# Patient Record
Sex: Female | Born: 1975
Health system: Southern US, Community
[De-identification: ages and names within clinical notes are randomized; demographics above are authoritative.]

## PROBLEM LIST (undated history)

## (undated) DIAGNOSIS — I10 Essential (primary) hypertension: Secondary | ICD-10-CM

## (undated) DIAGNOSIS — N879 Dysplasia of cervix uteri, unspecified: Secondary | ICD-10-CM

## (undated) DIAGNOSIS — R87612 Low grade squamous intraepithelial lesion on cytologic smear of cervix (LGSIL): Secondary | ICD-10-CM

## (undated) DIAGNOSIS — F329 Major depressive disorder, single episode, unspecified: Secondary | ICD-10-CM

## (undated) DIAGNOSIS — F419 Anxiety disorder, unspecified: Secondary | ICD-10-CM

## (undated) DIAGNOSIS — E669 Obesity, unspecified: Secondary | ICD-10-CM

## (undated) DIAGNOSIS — F32A Depression, unspecified: Secondary | ICD-10-CM

## (undated) DIAGNOSIS — K5900 Constipation, unspecified: Secondary | ICD-10-CM

## (undated) DIAGNOSIS — R87811 Vaginal high risk human papillomavirus (HPV) DNA test positive: Secondary | ICD-10-CM

## (undated) DIAGNOSIS — R87810 Cervical high risk human papillomavirus (HPV) DNA test positive: Secondary | ICD-10-CM

## (undated) DIAGNOSIS — E785 Hyperlipidemia, unspecified: Secondary | ICD-10-CM

## (undated) HISTORY — DX: Obesity, unspecified: E66.9

## (undated) HISTORY — DX: Hyperlipidemia, unspecified: E78.5

## (undated) HISTORY — DX: Dysplasia of cervix uteri, unspecified: N87.9

## (undated) HISTORY — DX: Low grade squamous intraepithelial lesion on cytologic smear of cervix (LGSIL): R87.612

## (undated) HISTORY — DX: Constipation, unspecified: K59.00

## (undated) HISTORY — DX: Cervical high risk human papillomavirus (HPV) DNA test positive: R87.810

## (undated) HISTORY — DX: Anxiety disorder, unspecified: F41.9

## (undated) HISTORY — PX: COLPOSCOPY: SHX161

## (undated) HISTORY — DX: Essential (primary) hypertension: I10

## (undated) HISTORY — DX: Vaginal high risk human papillomavirus (HPV) DNA test positive: R87.811

## (undated) HISTORY — PX: TUBAL LIGATION: SHX77

## (undated) HISTORY — DX: Depression, unspecified: F32.A

---
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
## 1996-02-17 DIAGNOSIS — O365990 Maternal care for other known or suspected poor fetal growth, unspecified trimester, not applicable or unspecified: Secondary | ICD-10-CM

## 1996-02-17 DIAGNOSIS — O139 Gestational [pregnancy-induced] hypertension without significant proteinuria, unspecified trimester: Secondary | ICD-10-CM

## 2015-04-01 ENCOUNTER — Encounter: Payer: Self-pay | Admitting: Unknown Physician Specialty

## 2015-04-01 ENCOUNTER — Ambulatory Visit (INDEPENDENT_AMBULATORY_CARE_PROVIDER_SITE_OTHER): Payer: 59 | Admitting: Unknown Physician Specialty

## 2015-04-01 VITALS — BP 121/76 | HR 77 | Temp 98.5°F | Ht 62.7 in | Wt 163.0 lb

## 2015-04-01 DIAGNOSIS — I129 Hypertensive chronic kidney disease with stage 1 through stage 4 chronic kidney disease, or unspecified chronic kidney disease: Secondary | ICD-10-CM

## 2015-04-01 DIAGNOSIS — E785 Hyperlipidemia, unspecified: Secondary | ICD-10-CM

## 2015-04-01 DIAGNOSIS — N189 Chronic kidney disease, unspecified: Secondary | ICD-10-CM | POA: Diagnosis not present

## 2015-04-01 LAB — LIPID PANEL PICCOLO, WAIVED
Chol/HDL Ratio Piccolo,Waive: 3.7 mg/dL
Cholesterol Piccolo, Waived: 219 mg/dL — ABNORMAL HIGH (ref <200)
HDL Chol Piccolo, Waived: 60 mg/dL (ref >59)
LDL Chol Calc Piccolo Waived: 116 mg/dL — ABNORMAL HIGH (ref <100)
Triglycerides Piccolo,Waived: 215 mg/dL — ABNORMAL HIGH (ref <150)
VLDL Chol Calc Piccolo,Waive: 43 mg/dL — ABNORMAL HIGH (ref <30)

## 2015-04-01 LAB — MICROALBUMIN, URINE WAIVED
Creatinine, Urine Waived: 100 mg/dL (ref 10–300)
Microalb, Ur Waived: 30 mg/L — ABNORMAL HIGH (ref 0–19)
Microalb/Creat Ratio: <30 mg/g (ref <30)

## 2015-04-01 MED ORDER — CHLORTHALIDONE 25 MG PO TABS: 25.0000 mg | ORAL_TABLET | Freq: Every day | ORAL | Status: DC

## 2015-04-01 MED ORDER — LISINOPRIL 2.5 MG PO TABS: 2.5000 mg | ORAL_TABLET | Freq: Every day | ORAL | Status: DC

## 2015-04-01 MED ORDER — GEMFIBROZIL 600 MG PO TABS: 600.0000 mg | ORAL_TABLET | Freq: Two times a day (BID) | ORAL | Status: DC

## 2015-04-01 NOTE — Assessment & Plan Note (Signed)
Reviewed Lipid panel.  Stable

## 2015-04-01 NOTE — Progress Notes (Signed)
BP 121/76 mmHg  Pulse 77  Temp(Src) 98.5 F (36.9 C)  Ht 5' 2.7" (1.593 m)  Wt 163 lb (73.936 kg)  BMI 29.14 kg/m2  SpO2 98%  LMP 03/07/2015 (Exact Date)   Subjective:    Patient ID: Julia Campbell, female    DOB: 12/23/1975, 39 y.o.   MRN: [id number]  HPI: Julia Campbell is a 39 y.o. female  Chief Complaint  Patient presents with  . Hyperlipidemia  . Hypertension   Satisfied with current treatment?  yes  H6  BP monitoring frequency:  rare H5  BP medication side effects:  no P1 Cholesterol medication side effects:  no P1  Cholesterol supplements:  none  P1 Past cholesterol medications:  none  P1 Medication compliance:  usually uses medication as prescribed   P1 takes everything once a day, sometimes forgets second dose Recent stressors:  no  H6   Recurrent headaches:  no  R10 Visual changes:  no  R2  Palpitations:  no  R4  Dyspnea:  no  R5  Chest pain:  no  R4  Lower extremity edema:  no  R4  Dizzy/lightheaded:  no  R10     Relevant past medical, surgical, family and social history reviewed and updated as indicated. Interim medical history since our last visit reviewed. Allergies and medications reviewed and updated.  Review of Systems  All other systems reviewed and are negative.   Per HPI unless specifically indicated above     Objective:    BP 121/76 mmHg  Pulse 77  Temp(Src) 98.5 F (36.9 C)  Ht 5' 2.7" (1.593 m)  Wt 163 lb (73.936 kg)  BMI 29.14 kg/m2  SpO2 98%  LMP 03/07/2015 (Exact Date)  Wt Readings from Last 3 Encounters:  04/01/15 163 lb (73.936 kg)    Physical Exam  Constitutional: She is oriented to person, place, and time. She appears well-developed and well-nourished. No distress.  HENT:  Head: Normocephalic and atraumatic.  Eyes: Conjunctivae and lids are normal. Right eye exhibits no discharge. Left eye exhibits no discharge. No scleral icterus.  Cardiovascular: Normal rate, regular rhythm and normal heart sounds.  Exam reveals no  gallop and no friction rub.   No murmur heard. Pulmonary/Chest: Effort normal and breath sounds normal. No respiratory distress.  Abdominal: Normal appearance. There is no splenomegaly or hepatomegaly.  Musculoskeletal: Normal range of motion.  Neurological: She is alert and oriented to person, place, and time.  Skin: Skin is intact. No rash noted. No pallor.  Psychiatric: She has a normal mood and affect. Her behavior is normal. Judgment and thought content normal.       Assessment & Plan:   Problem List Items Addressed This Visit      Cardiovascular and Mediastinum   Benign hypertension with chronic kidney disease - Primary    Stable.  Continue present medication.  Microalbumin 30.  Continue ACE      Relevant Medications   lisinopril (PRINIVIL,ZESTRIL) 2.5 MG tablet   gemfibrozil (LOPID) 600 MG tablet   chlorthalidone (HYGROTON) 25 MG tablet   Other Relevant Orders   Comprehensive metabolic panel   Microalbumin, Urine Waived     Other   Hyperlipidemia    Reviewed Lipid panel.  Stable      Relevant Medications   lisinopril (PRINIVIL,ZESTRIL) 2.5 MG tablet   gemfibrozil (LOPID) 600 MG tablet   chlorthalidone (HYGROTON) 25 MG tablet   Other Relevant Orders   Lipid Panel Piccolo, [place]  Follow up plan:    No Follow-up on file.

## 2015-04-01 NOTE — Assessment & Plan Note (Addendum)
Stable.  Continue present medication.  Microalbumin 30.  Continue ACE

## 2015-04-02 LAB — COMPREHENSIVE METABOLIC PANEL
ALT: 11 IU/L (ref 0–32)
AST: 15 IU/L (ref 0–40)
Albumin/Globulin Ratio: 1.7 (ref 1.1–2.5)
Albumin: 4.3 g/dL (ref 3.5–5.5)
Alkaline Phosphatase: 56 IU/L (ref 39–117)
BUN/Creatinine Ratio: 19 (ref 9–23)
BUN: 13 mg/dL (ref 6–24)
Bilirubin Total: 0.4 mg/dL (ref 0.0–1.2)
CO2: 24 mmol/L (ref 18–29)
Calcium: 9.5 mg/dL (ref 8.7–10.2)
Chloride: 98 mmol/L (ref 97–108)
Creatinine, Ser: 0.7 mg/dL (ref 0.57–1.00)
GFR calc Af Amer: 124 mL/min/{1.73_m2} (ref >59)
GFR calc non Af Amer: 108 mL/min/{1.73_m2} (ref >59)
Globulin, Total: 2.5 g/dL (ref 1.5–4.5)
Glucose: 85 mg/dL (ref 65–99)
Potassium: 4.2 mmol/L (ref 3.5–5.2)
Sodium: 138 mmol/L (ref 134–144)
Total Protein: 6.8 g/dL (ref 6.0–8.5)

## 2015-04-22 ENCOUNTER — Other Ambulatory Visit: Payer: Self-pay | Admitting: Unknown Physician Specialty

## 2015-10-05 ENCOUNTER — Encounter: Payer: Self-pay | Admitting: Unknown Physician Specialty

## 2015-10-05 ENCOUNTER — Ambulatory Visit (INDEPENDENT_AMBULATORY_CARE_PROVIDER_SITE_OTHER): Payer: 59 | Admitting: Unknown Physician Specialty

## 2015-10-05 VITALS — BP 118/78 | HR 87 | Temp 98.5°F | Ht 62.5 in | Wt 177.8 lb

## 2015-10-05 DIAGNOSIS — E785 Hyperlipidemia, unspecified: Secondary | ICD-10-CM | POA: Diagnosis not present

## 2015-10-05 DIAGNOSIS — N189 Chronic kidney disease, unspecified: Secondary | ICD-10-CM | POA: Diagnosis not present

## 2015-10-05 DIAGNOSIS — I129 Hypertensive chronic kidney disease with stage 1 through stage 4 chronic kidney disease, or unspecified chronic kidney disease: Secondary | ICD-10-CM | POA: Diagnosis not present

## 2015-10-05 LAB — LIPID PANEL PICCOLO, WAIVED
Chol/HDL Ratio Piccolo,Waive: 4.3 mg/dL
Cholesterol Piccolo, Waived: 243 mg/dL — ABNORMAL HIGH (ref <200)
HDL Chol Piccolo, Waived: 57 mg/dL — ABNORMAL LOW (ref >59)
LDL Chol Calc Piccolo Waived: 137 mg/dL — ABNORMAL HIGH (ref <100)
Triglycerides Piccolo,Waived: 248 mg/dL — ABNORMAL HIGH (ref <150)
VLDL Chol Calc Piccolo,Waive: 50 mg/dL — ABNORMAL HIGH (ref <30)

## 2015-10-05 LAB — MICROALBUMIN, URINE WAIVED
Creatinine, Urine Waived: 100 mg/dL (ref 10–300)
Microalb, Ur Waived: 30 mg/L — ABNORMAL HIGH (ref 0–19)
Microalb/Creat Ratio: <30 mg/g (ref <30)

## 2015-10-05 NOTE — Assessment & Plan Note (Signed)
LDL is up to 137.  ASCVD calculator says not in statin benefit group.  Will continue to monitor.  Discussed diet changes along with the Lopid to reduce cholesterol

## 2015-10-05 NOTE — Assessment & Plan Note (Signed)
Stable, continue present medications.   

## 2015-10-05 NOTE — Progress Notes (Signed)
BP 118/78 mmHg  Pulse 87  Temp(Src) 98.5 F (36.9 C)  Ht 5' 2.5" (1.588 m)  Wt 177 lb 12.8 oz (80.65 kg)  BMI 31.98 kg/m2  SpO2 98%  LMP 09/29/2015 (Exact Date)   Subjective:    Patient ID: Julia Campbell, female    DOB: 1976/01/06, 40 y.o.   MRN: [id number]  HPI: Julia Campbell is a 40 y.o. female  Chief Complaint  Patient presents with  . Hyperlipidemia  . Hypertension   Hypertension Using medications without difficulty Average home BPs typical pf what we got today   No problems or lightheadedness No chest pain with exertion or shortness of breath No Edema   Hyperlipidemia/hypertriglyceremia Using medications without problems No Muscle aches  Diet compliance: will work on this starting February.   Exercise: "very little"   Relevant past medical, surgical, family and social history reviewed and updated as indicated. Interim medical history since our last visit reviewed. Allergies and medications reviewed and updated.  Review of Systems  Per HPI unless specifically indicated above     Objective:    BP 118/78 mmHg  Pulse 87  Temp(Src) 98.5 F (36.9 C)  Ht 5' 2.5" (1.588 m)  Wt 177 lb 12.8 oz (80.65 kg)  BMI 31.98 kg/m2  SpO2 98%  LMP 09/29/2015 (Exact Date)  Wt Readings from Last 3 Encounters:  10/05/15 177 lb 12.8 oz (80.65 kg)  04/01/15 163 lb (73.936 kg)    Physical Exam  Constitutional: She is oriented to person, place, and time. She appears well-developed and well-nourished. No distress.  HENT:  Head: Normocephalic and atraumatic.  Eyes: Conjunctivae and lids are normal. Right eye exhibits no discharge. Left eye exhibits no discharge. No scleral icterus.  Neck: Normal range of motion. Neck supple. No JVD present. Carotid bruit is not present.  Cardiovascular: Normal rate, regular rhythm and normal heart sounds.   Pulmonary/Chest: Effort normal and breath sounds normal.  Abdominal: Normal appearance. There is no splenomegaly or hepatomegaly.   Musculoskeletal: Normal range of motion.  Neurological: She is alert and oriented to person, place, and time.  Skin: Skin is warm, dry and intact. No rash noted. No pallor.  Psychiatric: She has a normal mood and affect. Her behavior is normal. Judgment and thought content normal.    Results for orders placed or performed in visit on 04/01/15  Lipid Panel Piccol[facility], [employer]  Result Value Ref Range   Cholesterol Piccolo, Waived 219 (H) <200 mg/dL   HDL Chol Piccolo, Waived 60 >59 mg/dL   Triglycerides Piccolo,Waived 215 (H) <150 mg/dL   Chol/HDL Ratio Piccolo,Waive 3.7 mg/dL   LDL Chol Calc Piccolo Waived 116 (H) <100 mg/dL   VLDL Chol Calc Piccolo,Waive 43 (H) <30 mg/dL  Comprehensive metabolic panel  Result Value Ref Range   Glucose 85 65 - 99 mg/dL   BUN 13 6 - 24 mg/dL   Creatinine, Ser 0.70 0.57 - 1.00 mg/dL   GFR calc non Af Amer 108 >59 mL/min/1.73   GFR calc Af Amer 124 >59 mL/min/1.73   BUN/Creatinine Ratio 19 9 - 23   Sodium 138 134 - 144 mmol/L   Potassium 4.2 3.5 - 5.2 mmol/L   Chloride 98 97 - 108 mmol/L   CO2 24 18 - 29 mmol/L   Calcium 9.5 8.7 - 10.2 mg/dL   Total Protein 6.8 6.0 - 8.5 g/dL   Albumin 4.3 3.5 - 5.5 g/dL   Globulin, Total 2.5 1.5 - 4.5 g/dL   Albumin/Globulin Ratio 1.7  1.1 - 2.5   Bilirubin Total 0.4 0.0 - 1.2 mg/dL   Alkaline Phosphatase 56 39 - 117 IU/L   AST 15 0 - 40 IU/L   ALT 11 0 - 32 IU/L  Microalbumin, Urine Waived  Result Value Ref Range   Microalb, Ur Waived 30 (H) 0 - 19 mg/L   Creatinine, Urine Waived 100 10 - 300 mg/dL   Microalb/Creat Ratio <30 <30 mg/g      Assessment & Plan:   Problem List Items Addressed This Visit      Unprioritized   Benign hypertension with chronic kidney disease - Primary    Stable, continue present medications.        Relevant Orders   Comprehensive metabolic panel   Microalbumin, Urine Waived   Uric acid   Hyperlipidemia    LDL is up to 137.  ASCVD calculator says not in statin benefit  group.  Will continue to monitor.  Discussed diet changes along with the Lopid to reduce cholesterol      Relevant Orders   Lipid Panel Piccolo, Waived       Follow up plan: Return in about 6 months (around 04/03/2016).

## 2015-10-06 LAB — URIC ACID: Uric Acid: 6.2 mg/dL (ref 2.5–7.1)

## 2015-10-06 LAB — COMPREHENSIVE METABOLIC PANEL
ALT: 12 IU/L (ref 0–32)
AST: 11 IU/L (ref 0–40)
Albumin/Globulin Ratio: 1.6 (ref 1.1–2.5)
Albumin: 4.2 g/dL (ref 3.5–5.5)
Alkaline Phosphatase: 56 IU/L (ref 39–117)
BUN/Creatinine Ratio: 17 (ref 9–23)
BUN: 11 mg/dL (ref 6–24)
Bilirubin Total: 0.3 mg/dL (ref 0.0–1.2)
CO2: 25 mmol/L (ref 18–29)
Calcium: 9.5 mg/dL (ref 8.7–10.2)
Chloride: 99 mmol/L (ref 96–106)
Creatinine, Ser: 0.66 mg/dL (ref 0.57–1.00)
GFR calc Af Amer: 127 mL/min/{1.73_m2} (ref >59)
GFR calc non Af Amer: 110 mL/min/{1.73_m2} (ref >59)
Globulin, Total: 2.7 g/dL (ref 1.5–4.5)
Glucose: 86 mg/dL (ref 65–99)
Potassium: 4.5 mmol/L (ref 3.5–5.2)
Sodium: 138 mmol/L (ref 134–144)
Total Protein: 6.9 g/dL (ref 6.0–8.5)

## 2016-04-04 ENCOUNTER — Ambulatory Visit (INDEPENDENT_AMBULATORY_CARE_PROVIDER_SITE_OTHER): Payer: 59 | Admitting: Unknown Physician Specialty

## 2016-04-04 ENCOUNTER — Encounter: Payer: Self-pay | Admitting: Unknown Physician Specialty

## 2016-04-04 ENCOUNTER — Encounter (INDEPENDENT_AMBULATORY_CARE_PROVIDER_SITE_OTHER): Payer: Self-pay

## 2016-04-04 VITALS — BP 114/76 | HR 71 | Temp 98.2°F | Ht 62.5 in | Wt 165.2 lb

## 2016-04-04 DIAGNOSIS — Z5181 Encounter for therapeutic drug level monitoring: Secondary | ICD-10-CM

## 2016-04-04 DIAGNOSIS — I10 Essential (primary) hypertension: Secondary | ICD-10-CM | POA: Diagnosis not present

## 2016-04-04 DIAGNOSIS — E785 Hyperlipidemia, unspecified: Secondary | ICD-10-CM

## 2016-04-04 LAB — LIPID PANEL PICCOLO, WAIVED
Chol/HDL Ratio Piccolo,Waive: 3.9 mg/dL
Cholesterol Piccolo, Waived: 223 mg/dL — ABNORMAL HIGH (ref <200)
HDL Chol Piccolo, Waived: 57 mg/dL — ABNORMAL LOW (ref >59)
LDL Chol Calc Piccolo Waived: 125 mg/dL — ABNORMAL HIGH (ref <100)
Triglycerides Piccolo,Waived: 203 mg/dL — ABNORMAL HIGH (ref <150)
VLDL Chol Calc Piccolo,Waive: 41 mg/dL — ABNORMAL HIGH (ref <30)

## 2016-04-04 MED ORDER — LISINOPRIL 2.5 MG PO TABS: 2.5000 mg | ORAL_TABLET | Freq: Every day | ORAL | Status: DC

## 2016-04-04 MED ORDER — GEMFIBROZIL 600 MG PO TABS: 600.0000 mg | ORAL_TABLET | Freq: Two times a day (BID) | ORAL | Status: DC

## 2016-04-04 NOTE — Assessment & Plan Note (Signed)
All numbers improved.  Continue present medications, diet and exercise

## 2016-04-04 NOTE — Assessment & Plan Note (Signed)
Stable, continue present medications.   

## 2016-04-04 NOTE — Progress Notes (Signed)
BP 114/76 mmHg  Pulse 71  Temp(Src) 98.2 F (36.8 C)  Ht 5' 2.5" (1.588 m)  Wt 165 lb 3.2 oz (74.934 kg)  BMI 29.72 kg/m2  SpO2 99%  LMP 03/24/2016 (Exact Date)   Subjective:    Patient ID: Julia Campbell, female    DOB: February 27, 1976, 40 y.o.   MRN: [id number]  HPI: Julia Campbell is a 41 y.o. female  Chief Complaint  Patient presents with  . Hyperlipidemia  . Hypertension   Hypertension Using medications without difficulty Average home BPs Not checking  No problems or lightheadedness No chest pain with exertion or shortness of breath No Edema   Hyperlipidemia Using medications without problems No Muscle aches  Diet compliance: Weight watchers Exercise: Does some walking   Relevant past medical, surgical, family and social history reviewed and updated as indicated. Interim medical history since our last visit reviewed. Allergies and medications reviewed and updated.  Review of Systems  Per HPI unless specifically indicated above     Objective:    BP 114/76 mmHg  Pulse 71  Temp(Src) 98.2 F (36.8 C)  Ht 5' 2.5" (1.588 m)  Wt 165 lb 3.2 oz (74.934 kg)  BMI 29.72 kg/m2  SpO2 99%  LMP 03/24/2016 (Exact Date)  Wt Readings from Last 3 Encounters:  04/04/16 165 lb 3.2 oz (74.934 kg)  10/05/15 177 lb 12.8 oz (80.65 kg)  04/01/15 163 lb (73.936 kg)    Physical Exam  Constitutional: She is oriented to person, place, and time. She appears well-developed and well-nourished. No distress.  HENT:  Head: Normocephalic and atraumatic.  Eyes: Conjunctivae and lids are normal. Right eye exhibits no discharge. Left eye exhibits no discharge. No scleral icterus.  Neck: Normal range of motion. Neck supple. No JVD present. Carotid bruit is not present.  Cardiovascular: Normal rate, regular rhythm and normal heart sounds.   Pulmonary/Chest: Effort normal and breath sounds normal.  Abdominal: Normal appearance. There is no splenomegaly or hepatomegaly.  Musculoskeletal:  Normal range of motion.  Neurological: She is alert and oriented to person, place, and time.  Skin: Skin is warm, dry and intact. No rash noted. No pallor.  Psychiatric: She has a normal mood and affect. Her behavior is normal. Judgment and thought content normal.    Results for orders placed or performed in visit on 10/05/15  Comprehensive metabolic panel  Result Value Ref Range   Glucose 86 65 - 99 mg/dL   BUN 11 6 - 24 mg/dL   Creatinine, Ser 0.66 0.57 - 1.00 mg/dL   GFR calc non Af Amer 110 >59 mL/min/1.73   GFR calc Af Amer 127 >59 mL/min/1.73   BUN/Creatinine Ratio 17 9 - 23   Sodium 138 134 - 144 mmol/L   Potassium 4.5 3.5 - 5.2 mmol/L   Chloride 99 96 - 106 mmol/L   CO2 25 18 - 29 mmol/L   Calcium 9.5 8.7 - 10.2 mg/dL   Total Protein 6.9 6.0 - 8.5 g/dL   Albumin 4.2 3.5 - 5.5 g/dL   Globulin, Total 2.7 1.5 - 4.5 g/dL   Albumin/Globulin Ratio 1.6 1.1 - 2.5   Bilirubin Total 0.3 0.0 - 1.2 mg/dL   Alkaline Phosphatase 56 39 - 117 IU/L   AST 11 0 - 40 IU/L   ALT 12 0 - 32 IU/L  Uric acid  Result Value Ref Range   Uric Acid 6.2 2.5 - 7.1 mg/dL  Lipid Panel Piccolo, Waived  Result Value Ref Range  Cholesterol Piccolo, Waived 243 (H) <200 mg/dL   HDL Chol Piccolo, Waived 57 (L) >59 mg/dL   Triglycerides Piccolo,Waived 248 (H) <150 mg/dL   Chol/HDL Ratio Piccolo,Waive 4.3 mg/dL   LDL Chol Calc Piccolo Waived 137 (H) <100 mg/dL   VLDL Chol Calc Piccolo,Waive 50 (H) <30 mg/dL  Microalbumin, Urine Waived  Result Value Ref Range   Microalb, Ur Waived 30 (H) 0 - 19 mg/L   Creatinine, Urine Waived 100 10 - 300 mg/dL   Microalb/Creat Ratio <30 <30 mg/g      Assessment & Plan:   Problem List Items Addressed This Visit      Unprioritized   Essential hypertension    Stable, continue present medications.        Relevant Medications   lisinopril (PRINIVIL,ZESTRIL) 2.5 MG tablet   gemfibrozil (LOPID) 600 MG tablet   Other Relevant Orders   Comprehensive metabolic panel    Hyperlipidemia - Primary    All numbers improved.  Continue present medications, diet and exercise      Relevant Medications   lisinopril (PRINIVIL,ZESTRIL) 2.5 MG tablet   gemfibrozil (LOPID) 600 MG tablet   Other Relevant Orders   Lipid Panel Piccolo, Waived   Medication monitoring encounter       Follow up plan: Return in about 6 months (around 10/05/2016).

## 2016-04-05 LAB — COMPREHENSIVE METABOLIC PANEL
ALT: 10 IU/L (ref 0–32)
AST: 12 IU/L (ref 0–40)
Albumin/Globulin Ratio: 1.7 (ref 1.2–2.2)
Albumin: 4.3 g/dL (ref 3.5–5.5)
Alkaline Phosphatase: 61 IU/L (ref 39–117)
BUN/Creatinine Ratio: 23 (ref 9–23)
BUN: 14 mg/dL (ref 6–24)
Bilirubin Total: 0.4 mg/dL (ref 0.0–1.2)
CO2: 26 mmol/L (ref 18–29)
Calcium: 9.4 mg/dL (ref 8.7–10.2)
Chloride: 97 mmol/L (ref 96–106)
Creatinine, Ser: 0.62 mg/dL (ref 0.57–1.00)
GFR calc Af Amer: 129 mL/min/{1.73_m2} (ref >59)
GFR calc non Af Amer: 112 mL/min/{1.73_m2} (ref >59)
Globulin, Total: 2.6 g/dL (ref 1.5–4.5)
Glucose: 77 mg/dL (ref 65–99)
Potassium: 4 mmol/L (ref 3.5–5.2)
Sodium: 138 mmol/L (ref 134–144)
Total Protein: 6.9 g/dL (ref 6.0–8.5)

## 2016-04-06 ENCOUNTER — Encounter: Payer: Self-pay | Admitting: Unknown Physician Specialty

## 2016-04-21 ENCOUNTER — Other Ambulatory Visit: Payer: Self-pay | Admitting: Unknown Physician Specialty

## 2016-04-23 ENCOUNTER — Telehealth: Payer: Self-pay | Admitting: Unknown Physician Specialty

## 2016-04-23 NOTE — Telephone Encounter (Signed)
Called pharmacy. They state that they have all of the prescriptions that were sent in. They stated that they have the gemfibrozil and lisinopril on hold because it is too soon for refills ( pt was given 3 month supplies less than 3 months ago). Will call patient and let her know this.

## 2016-04-23 NOTE — Telephone Encounter (Signed)
Called and left patient a voicemail asking for her to please return my call.  

## 2016-04-24 NOTE — Telephone Encounter (Signed)
Called and left patient a voicemail asking for her to please return my call.  

## 2016-04-25 NOTE — Telephone Encounter (Signed)
Called and left patient a voicemail letting her know that her medications are at the pharmacy and that it is just to early for her to get them. I let her know that they would be available at the appropriate time based on last medication refill. I asked for her to please give [name] a call if she has any questions or concerns.

## 2016-06-01 ENCOUNTER — Telehealth: Payer: Self-pay | Admitting: Unknown Physician Specialty

## 2016-06-01 NOTE — Telephone Encounter (Signed)
Pt would like to speak to PCP about possibly taking contrave.

## 2016-06-01 NOTE — Telephone Encounter (Signed)
Routing to PCP

## 2016-06-01 NOTE — Telephone Encounter (Signed)
I am OK with prescibing but needs an office visit

## 2016-06-01 NOTE — Telephone Encounter (Signed)
Appt scheduled 06/05/16.

## 2016-06-05 ENCOUNTER — Encounter: Payer: Self-pay | Admitting: Unknown Physician Specialty

## 2016-06-05 ENCOUNTER — Ambulatory Visit (INDEPENDENT_AMBULATORY_CARE_PROVIDER_SITE_OTHER): Payer: 59 | Admitting: Unknown Physician Specialty

## 2016-06-05 VITALS — BP 114/78 | HR 71 | Temp 98.9°F | Ht 62.9 in | Wt 174.8 lb

## 2016-06-05 DIAGNOSIS — E663 Overweight: Secondary | ICD-10-CM | POA: Insufficient documentation

## 2016-06-05 DIAGNOSIS — E669 Obesity, unspecified: Secondary | ICD-10-CM | POA: Diagnosis not present

## 2016-06-05 MED ORDER — NALTREXONE-BUPROPION HCL ER 8-90 MG PO TB12: ORAL_TABLET | ORAL | 2 refills | Status: DC

## 2016-06-05 NOTE — Assessment & Plan Note (Signed)
Start Contrave.  Pt ed on diet and exercise.  Planning on starting Weight Watchers again.

## 2016-06-05 NOTE — Progress Notes (Signed)
BP 114/78 (BP Location: Right Arm, Patient Position: Sitting, Cuff Size: Normal)   Pulse 71   Temp 98.9 F (37.2 C)   Ht 5' 2.9" (1.598 m)   Wt 174 lb 12.8 oz (79.3 kg)   LMP 05/22/2016 (Approximate)   SpO2 96%   BMI 31.06 kg/m    Subjective:    Patient ID: Julia Campbell, female    DOB: Mar 29, 1976, 40 y.o.   MRN: [id number]  HPI: Julia Campbell is a 40 y.o. female  Chief Complaint  Patient presents with  . Weight Check    pt states she wants to talk with Malachy Mood about starting Contrave    Pt wants to start Contrave.  She states she had a 12 pound weight loss but had gained it all back and struggling with for a very long time.  She struggles with appetite.  She has been off an on Weight Watchers for the last several years.  She states she struggles with consistent maintenance.    Relevant past medical, surgical, family and social history reviewed and updated as indicated. Interim medical history since our last visit reviewed. Allergies and medications reviewed and updated.  Review of Systems  Per HPI unless specifically indicated above     Objective:    BP 114/78 (BP Location: Right Arm, Patient Position: Sitting, Cuff Size: Normal)   Pulse 71   Temp 98.9 F (37.2 C)   Ht 5' 2.9" (1.598 m)   Wt 174 lb 12.8 oz (79.3 kg)   LMP 05/22/2016 (Approximate)   SpO2 96%   BMI 31.06 kg/m   Wt Readings from Last 3 Encounters:  06/05/16 174 lb 12.8 oz (79.3 kg)  04/04/16 165 lb 3.2 oz (74.9 kg)  10/05/15 177 lb 12.8 oz (80.6 kg)    Physical Exam  Constitutional: She is oriented to person, place, and time. She appears well-developed and well-nourished. No distress.  HENT:  Head: Normocephalic and atraumatic.  Eyes: Conjunctivae and lids are normal. Right eye exhibits no discharge. Left eye exhibits no discharge. No scleral icterus.  Neck: Normal range of motion. Neck supple. No JVD present. Carotid bruit is not present.  Cardiovascular: Normal rate, regular rhythm and normal  heart sounds.   Pulmonary/Chest: Effort normal and breath sounds normal.  Abdominal: Normal appearance. There is no splenomegaly or hepatomegaly.  Musculoskeletal: Normal range of motion.  Neurological: She is alert and oriented to person, place, and time.  Skin: Skin is warm, dry and intact. No rash noted. No pallor.  Psychiatric: She has a normal mood and affect. Her behavior is normal. Judgment and thought content normal.    Results for orders placed or performed in visit on 04/04/16  Comprehensive metabolic panel  Result Value Ref Range   Glucose 77 65 - 99 mg/dL   BUN 14 6 - 24 mg/dL   Creatinine, Ser 0.62 0.57 - 1.00 mg/dL   GFR calc non Af Amer 112 >59 mL/min/1.73   GFR calc Af Amer 129 >59 mL/min/1.73   BUN/Creatinine Ratio 23 9 - 23   Sodium 138 134 - 144 mmol/L   Potassium 4.0 3.5 - 5.2 mmol/L   Chloride 97 96 - 106 mmol/L   CO2 26 18 - 29 mmol/L   Calcium 9.4 8.7 - 10.2 mg/dL   Total Protein 6.9 6.0 - 8.5 g/dL   Albumin 4.3 3.5 - 5.5 g/dL   Globulin, Total 2.6 1.5 - 4.5 g/dL   Albumin/Globulin Ratio 1.7 1.2 - 2.2   Bilirubin Total  0.4 0.0 - 1.2 mg/dL   Alkaline Phosphatase 61 39 - 117 IU/L   AST 12 0 - 40 IU/L   ALT 10 0 - 32 IU/L  Lipid Panel Piccolo, Waived  Result Value Ref Range   Cholesterol Piccolo, Waived 223 (H) <200 mg/dL   HDL Chol Piccolo, Waived 57 (L) >59 mg/dL   Triglycerides Piccolo,Waived 203 (H) <150 mg/dL   Chol/HDL Ratio Piccolo,Waive 3.9 mg/dL   LDL Chol Calc Piccolo Waived 125 (H) <100 mg/dL   VLDL Chol Calc Piccolo,Waive 41 (H) <30 mg/dL      Assessment & Plan:   Problem List Items Addressed This Visit      Unprioritized   Obesity - Primary    Start Contrave.  Pt ed on diet and exercise.  Planning on starting Weight Watchers again.        Relevant Medications   Naltrexone-Bupropion HCl ER (CONTRAVE) 8-90 MG TB12    Other Visit Diagnoses   None.      Follow up plan: Return in about 6 weeks (around 07/17/2016).

## 2016-06-07 ENCOUNTER — Telehealth: Payer: Self-pay

## 2016-06-07 NOTE — Telephone Encounter (Signed)
Pt states that she needs a medication called in for a UTI. Pt states that she had an office visit with Malachy Mood on 06/05/16 so feels that this request will be honored... Please call patient to advise.

## 2016-06-08 MED ORDER — SULFAMETHOXAZOLE-TRIMETHOPRIM 800-160 MG PO TABS: 1.0000 | ORAL_TABLET | Freq: Two times a day (BID) | ORAL | 0 refills | Status: DC

## 2016-06-08 NOTE — Telephone Encounter (Signed)
Called and left patient a voicemail asking for her to please return my call. Need to know what kind of symptoms patient is experiencing.

## 2016-06-08 NOTE — Telephone Encounter (Signed)
Routing to provider  

## 2016-07-17 ENCOUNTER — Ambulatory Visit (INDEPENDENT_AMBULATORY_CARE_PROVIDER_SITE_OTHER): Payer: 59 | Admitting: Unknown Physician Specialty

## 2016-07-17 ENCOUNTER — Encounter: Payer: Self-pay | Admitting: Unknown Physician Specialty

## 2016-07-17 VITALS — BP 126/82 | HR 91 | Temp 98.4°F | Wt 161.4 lb

## 2016-07-17 DIAGNOSIS — Z6830 Body mass index (BMI) 30.0-30.9, adult: Secondary | ICD-10-CM | POA: Diagnosis not present

## 2016-07-17 DIAGNOSIS — E6609 Other obesity due to excess calories: Secondary | ICD-10-CM | POA: Diagnosis not present

## 2016-07-17 MED ORDER — NALTREXONE-BUPROPION HCL ER 8-90 MG PO TB12: ORAL_TABLET | ORAL | 2 refills | Status: DC

## 2016-07-17 NOTE — Progress Notes (Signed)
BP 126/82 (BP Location: Left Arm, Patient Position: Sitting, Cuff Size: Large)   Pulse 91   Temp 98.4 F (36.9 C)   Wt 161 lb 6.4 oz (73.2 kg)   LMP 06/20/2016 (Approximate)   SpO2 97%   BMI 28.68 kg/m    Subjective:    Patient ID: Julia Campbell, female    DOB: [birth year], 40 y.o.   MRN: [id number]  HPI: Julia Campbell is a 40 y.o. female  Chief Complaint  Patient presents with  . Obesity    6 week f/up after starting Contrave   Obesity Pt is here for f/u weight loss.  Taking Contrave and has joined Veterinary surgeon.  She is taking the contrave without negative side-effects.    Relevant past medical, surgical, family and social history reviewed and updated as indicated. Interim medical history since our last visit reviewed. Allergies and medications reviewed and updated.  Review of Systems  Per HPI unless specifically indicated above     Objective:    BP 126/82 (BP Location: Left Arm, Patient Position: Sitting, Cuff Size: Large)   Pulse 91   Temp 98.4 F (36.9 C)   Wt 161 lb 6.4 oz (73.2 kg)   LMP 06/20/2016 (Approximate)   SpO2 97%   BMI 28.68 kg/m   Wt Readings from Last 3 Encounters:  07/17/16 161 lb 6.4 oz (73.2 kg)  06/05/16 174 lb 12.8 oz (79.3 kg)  04/04/16 165 lb 3.2 oz (74.9 kg)    Physical Exam  Constitutional: She is oriented to person, place, and time. She appears well-developed and well-nourished. No distress.  HENT:  Head: Normocephalic and atraumatic.  Eyes: Conjunctivae and lids are normal. Right eye exhibits no discharge. Left eye exhibits no discharge. No scleral icterus.  Neck: Normal range of motion. Neck supple. No JVD present. Carotid bruit is not present.  Cardiovascular: Normal rate, regular rhythm and normal heart sounds.   Pulmonary/Chest: Effort normal and breath sounds normal.  Abdominal: Normal appearance. There is no splenomegaly or hepatomegaly.  Musculoskeletal: Normal range of motion.  Neurological: She is alert and oriented  to person, place, and time.  Skin: Skin is warm, dry and intact. No rash noted. No pallor.  Psychiatric: She has a normal mood and affect. Her behavior is normal. Judgment and thought content normal.    Results for orders placed or performed in visit on 04/04/16  Comprehensive metabolic panel  Result Value Ref Range   Glucose 77 65 - 99 mg/dL   BUN 14 6 - 24 mg/dL   Creatinine, Ser 0.62 0.57 - 1.00 mg/dL   GFR calc non Af Amer 112 >59 mL/min/1.73   GFR calc Af Amer 129 >59 mL/min/1.73   BUN/Creatinine Ratio 23 9 - 23   Sodium 138 134 - 144 mmol/L   Potassium 4.0 3.5 - 5.2 mmol/L   Chloride 97 96 - 106 mmol/L   CO2 26 18 - 29 mmol/L   Calcium 9.4 8.7 - 10.2 mg/dL   Total Protein 6.9 6.0 - 8.5 g/dL   Albumin 4.3 3.5 - 5.5 g/dL   Globulin, Total 2.6 1.5 - 4.5 g/dL   Albumin/Globulin Ratio 1.7 1.2 - 2.2   Bilirubin Total 0.4 0.0 - 1.2 mg/dL   Alkaline Phosphatase 61 39 - 117 IU/L   AST 12 0 - 40 IU/L   ALT 10 0 - 32 IU/L  Lipid Panel Piccolo, Waived  Result Value Ref Range   Cholesterol Piccolo, Waived 223 (H) <200 mg/dL   HDL  Chol Piccolo, Waived 57 (L) >59 mg/dL   Triglycerides Piccolo,Waived 203 (H) <150 mg/dL   Chol/HDL Ratio Piccolo,Waive 3.9 mg/dL   LDL Chol Calc Piccolo Waived 125 (H) <100 mg/dL   VLDL Chol Calc Piccolo,Waive 41 (H) <30 mg/dL      Assessment & Plan:   Problem List Items Addressed This Visit    None    Visit Diagnoses   None.     Obesity Doing well with Contrave.  No negative side-effects.    Follow up plan: Return in about 3 months (around 10/17/2016).

## 2016-07-17 NOTE — Assessment & Plan Note (Signed)
Doing well with Contrave.  No negative side-effects.

## 2016-10-05 ENCOUNTER — Ambulatory Visit: Payer: 59 | Admitting: Unknown Physician Specialty

## 2016-10-09 ENCOUNTER — Encounter: Payer: Self-pay | Admitting: Unknown Physician Specialty

## 2016-10-09 ENCOUNTER — Ambulatory Visit (INDEPENDENT_AMBULATORY_CARE_PROVIDER_SITE_OTHER): Payer: 59 | Admitting: Unknown Physician Specialty

## 2016-10-09 DIAGNOSIS — Z6830 Body mass index (BMI) 30.0-30.9, adult: Secondary | ICD-10-CM | POA: Diagnosis not present

## 2016-10-09 DIAGNOSIS — E6609 Other obesity due to excess calories: Secondary | ICD-10-CM | POA: Diagnosis not present

## 2016-10-09 DIAGNOSIS — E785 Hyperlipidemia, unspecified: Secondary | ICD-10-CM | POA: Diagnosis not present

## 2016-10-09 DIAGNOSIS — I10 Essential (primary) hypertension: Secondary | ICD-10-CM | POA: Diagnosis not present

## 2016-10-09 NOTE — Progress Notes (Signed)
BP 111/75 (BP Location: Left Arm, Patient Position: Sitting, Cuff Size: Large)   Pulse 78   Temp 97.8 F (36.6 C)   Ht 5\' 1"  (1.549 m)   Wt 159 lb 12.8 oz (72.5 kg)   LMP 09/12/2016 (Approximate)   SpO2 99%   BMI 30.19 kg/m    Subjective:    Patient ID: Julia Campbell, female    DOB: September 09, 1976, 41 y.o.   MRN: [id number]  HPI: [name] is a 41 y.o. female  Chief Complaint  Patient presents with  . Hyperlipidemia  . Hypertension   Hypertension Using medications without difficulty Average home BPs 115/72-73     No problems or lightheadedness No chest pain with exertion or shortness of breath No Edema   Hyperlipidemia Using medications without problems No Muscle aches  Diet compliance: Was doing weight watchers but not as strict.  Smaller portions.   Exercise: Does some walking  Obesity Taking Contrave.  She is down a 15 pounds since starting.    Relevant past medical, surgical, family and social history reviewed and updated as indicated. Interim medical history since our last visit reviewed. Allergies and medications reviewed and updated.  Review of Systems  Per HPI unless specifically indicated above     Objective:    BP 111/75 (BP Location: Left Arm, Patient Position: Sitting, Cuff Size: Large)   Pulse 78   Temp 97.8 F (36.6 C)   Ht 5\' 1"  (1.549 m)   Wt 159 lb 12.8 oz (72.5 kg)   LMP 09/12/2016 (Approximate)   SpO2 99%   BMI 30.19 kg/m   Wt Readings from Last 3 Encounters:  10/09/16 159 lb 12.8 oz (72.5 kg)  07/17/16 161 lb 6.4 oz (73.2 kg)  06/05/16 174 lb 12.8 oz (79.3 kg)    Physical Exam  Constitutional: She is oriented to person, place, and time. She appears well-developed and well-nourished. No distress.  HENT:  Head: Normocephalic and atraumatic.  Eyes: Conjunctivae and lids are normal. Right eye exhibits no discharge. Left eye exhibits no discharge. No scleral icterus.  Neck: Normal range of motion. Neck supple. No JVD present.  Carotid bruit is not present.  Cardiovascular: Normal rate, regular rhythm and normal heart sounds.   Pulmonary/Chest: Effort normal and breath sounds normal.  Abdominal: Normal appearance. There is no splenomegaly or hepatomegaly.  Musculoskeletal: Normal range of motion.  Neurological: She is alert and oriented to person, place, and time.  Skin: Skin is warm, dry and intact. No rash noted. No pallor.  Psychiatric: She has a normal mood and affect. Her behavior is normal. Judgment and thought content normal.    Results for orders placed or performed in visit on 04/04/16  Comprehensive metabolic panel  Result Value Ref Range   Glucose 77 65 - 99 mg/dL   BUN 14 6 - 24 mg/dL   Creatinine, Ser 0.62 0.57 - 1.00 mg/dL   GFR calc non Af Amer 112 >59 mL/min/1.73   GFR calc Af Amer 129 >59 mL/min/1.73   BUN/Creatinine Ratio 23 9 - 23   Sodium 138 134 - 144 mmol/L   Potassium 4.0 3.5 - 5.2 mmol/L   Chloride 97 96 - 106 mmol/L   CO2 26 18 - 29 mmol/L   Calcium 9.4 8.7 - 10.2 mg/dL   Total Protein 6.9 6.0 - 8.5 g/dL   Albumin 4.3 3.5 - 5.5 g/dL   Globulin, Total 2.6 1.5 - 4.5 g/dL   Albumin/Globulin Ratio 1.7 1.2 - 2.2  Bilirubin Total 0.4 0.0 - 1.2 mg/dL   Alkaline Phosphatase 61 39 - 117 IU/L   AST 12 0 - 40 IU/L   ALT 10 0 - 32 IU/L  Lipid Panel Piccolo, Waived  Result Value Ref Range   Cholesterol Piccolo, Waived 223 (H) <200 mg/dL   HDL Chol Piccolo, Waived 57 (L) >59 mg/dL   Triglycerides Piccolo,Waived 203 (H) <150 mg/dL   Chol/HDL Ratio Piccolo,Waive 3.9 mg/dL   LDL Chol Calc Piccolo Waived 125 (H) <100 mg/dL   VLDL Chol Calc Piccolo,Waive 41 (H) <30 mg/dL      Assessment & Plan:   Problem List Items Addressed This Visit      Unprioritized   Essential hypertension    Stable, continue present medications.        Relevant Orders   Comprehensive metabolic panel   Hyperlipidemia    Check cholesterol      Relevant Orders   Lipid Panel w/o Chol/HDL Ratio   Obesity      Continue Contrave.  Doing well          Follow up plan: Return in about 6 months (around 04/08/2017) for physical.

## 2016-10-09 NOTE — Assessment & Plan Note (Signed)
Continue Contrave.  Doing well

## 2016-10-09 NOTE — Assessment & Plan Note (Signed)
Stable, continue present medications.   

## 2016-10-09 NOTE — Assessment & Plan Note (Signed)
Check cholesterol.

## 2016-10-10 ENCOUNTER — Encounter: Payer: Self-pay | Admitting: Unknown Physician Specialty

## 2016-10-10 LAB — COMPREHENSIVE METABOLIC PANEL
ALT: 10 IU/L (ref 0–32)
AST: 12 IU/L (ref 0–40)
Albumin/Globulin Ratio: 1.7 (ref 1.2–2.2)
Albumin: 4.2 g/dL (ref 3.5–5.5)
Alkaline Phosphatase: 53 IU/L (ref 39–117)
BUN/Creatinine Ratio: 20 (ref 9–23)
BUN: 14 mg/dL (ref 6–24)
Bilirubin Total: 0.3 mg/dL (ref 0.0–1.2)
CO2: 22 mmol/L (ref 18–29)
Calcium: 9.4 mg/dL (ref 8.7–10.2)
Chloride: 100 mmol/L (ref 96–106)
Creatinine, Ser: 0.71 mg/dL (ref 0.57–1.00)
GFR calc Af Amer: 121 mL/min/{1.73_m2} (ref >59)
GFR calc non Af Amer: 105 mL/min/{1.73_m2} (ref >59)
Globulin, Total: 2.5 g/dL (ref 1.5–4.5)
Glucose: 82 mg/dL (ref 65–99)
Potassium: 3.8 mmol/L (ref 3.5–5.2)
Sodium: 140 mmol/L (ref 134–144)
Total Protein: 6.7 g/dL (ref 6.0–8.5)

## 2016-10-10 LAB — LIPID PANEL W/O CHOL/HDL RATIO
Cholesterol, Total: 191 mg/dL (ref 100–199)
HDL: 52 mg/dL (ref >39)
LDL Calculated: 112 mg/dL — ABNORMAL HIGH (ref 0–99)
Triglycerides: 133 mg/dL (ref 0–149)
VLDL Cholesterol Cal: 27 mg/dL (ref 5–40)

## 2016-10-15 ENCOUNTER — Other Ambulatory Visit: Payer: Self-pay | Admitting: Unknown Physician Specialty

## 2016-10-15 MED ORDER — LISINOPRIL 2.5 MG PO TABS: 2.5000 mg | ORAL_TABLET | Freq: Every day | ORAL | 1 refills | Status: DC

## 2016-10-15 NOTE — Telephone Encounter (Signed)
Routing to provider  

## 2017-03-17 DIAGNOSIS — R87612 Low grade squamous intraepithelial lesion on cytologic smear of cervix (LGSIL): Secondary | ICD-10-CM

## 2017-03-17 DIAGNOSIS — R87811 Vaginal high risk human papillomavirus (HPV) DNA test positive: Secondary | ICD-10-CM

## 2017-03-17 HISTORY — DX: Vaginal high risk human papillomavirus (HPV) DNA test positive: R87.811

## 2017-03-17 HISTORY — DX: Low grade squamous intraepithelial lesion on cytologic smear of cervix (LGSIL): R87.612

## 2017-04-03 ENCOUNTER — Encounter: Payer: Self-pay | Admitting: Certified Nurse Midwife

## 2017-04-03 ENCOUNTER — Ambulatory Visit (INDEPENDENT_AMBULATORY_CARE_PROVIDER_SITE_OTHER): Payer: 59 | Admitting: Certified Nurse Midwife

## 2017-04-03 VITALS — BP 110/70 | HR 78 | Ht 63.0 in | Wt 177.0 lb

## 2017-04-03 DIAGNOSIS — R87810 Cervical high risk human papillomavirus (HPV) DNA test positive: Secondary | ICD-10-CM | POA: Diagnosis not present

## 2017-04-03 DIAGNOSIS — Z1231 Encounter for screening mammogram for malignant neoplasm of breast: Secondary | ICD-10-CM

## 2017-04-03 DIAGNOSIS — R87610 Atypical squamous cells of undetermined significance on cytologic smear of cervix (ASC-US): Secondary | ICD-10-CM | POA: Diagnosis not present

## 2017-04-03 DIAGNOSIS — Z01419 Encounter for gynecological examination (general) (routine) without abnormal findings: Secondary | ICD-10-CM | POA: Diagnosis not present

## 2017-04-03 DIAGNOSIS — Z124 Encounter for screening for malignant neoplasm of cervix: Secondary | ICD-10-CM | POA: Diagnosis not present

## 2017-04-03 DIAGNOSIS — Z1239 Encounter for other screening for malignant neoplasm of breast: Secondary | ICD-10-CM

## 2017-04-03 NOTE — Progress Notes (Signed)
Gynecology Annual Exam  PCP: Kathrine Haddock, NP  Chief Complaint:  Chief Complaint  Patient presents with  . Gynecologic Exam    History of Present Illness:Julia Campbell presents today for her annual exam. She is a 41 year old Caucasian/White female, G2 P61, menstruating female.. She is having no significant problems. Her LMP was 03/26/17.    Last Pap: 04/02/16 Result:ASCUS, HPV positive ;history of abnormal Pap smears since 2013. Had a colpo and biopsies in 2014: results CIN1 Last Mammogram: 05/18/2016 Result: BIRADS 2 (BI) Last Colonoscopy: N/A - n/a  Last DEXA: never Findings: N/A  Osteoporosis Prevention: does get calcium in diet. No exercise Contraception Method: Tubal Ligation  Her menses are regular, moderate, not painful; they are monthly and last 5 days She has had no spotting.  The patient's past medical history is detailed in the past medical history section. Hypercholesterolemia and Hypertension. Since her last annual GYN exam 04/02/2016, she has had no significant changes in her health history. She has moved to [place] and works in [employer]. She is sexually active.  There is no family history of breast cancer The patient does do monthly self breast exams.  The patient does not drink alcohol, does not use recreational drugs, and does not smoke. Her last cholesterol check was in 2018 and was abnormal (done by PCP) Past Medical History  The patient denies current symptoms of depression.    Review of Systems: Review of Systems  Constitutional: Negative for chills, fever and weight loss.  HENT: Negative for congestion, sinus pain and sore throat.   Eyes: Negative for blurred vision and pain.  Respiratory: Negative for hemoptysis, shortness of breath and wheezing.   Cardiovascular: Negative for chest pain, palpitations and leg swelling.  Gastrointestinal: Negative for abdominal pain, blood in stool, diarrhea, heartburn, nausea and vomiting.  Genitourinary:  Negative for dysuria, frequency, hematuria and urgency.  Musculoskeletal: Negative for back pain, joint pain and myalgias.  Skin: Negative for itching and rash.  Neurological: Negative for dizziness, tingling and headaches.  Endo/Heme/Allergies: Negative for environmental allergies and polydipsia. Does not bruise/bleed easily.       Negative for hirsutism   Psychiatric/Behavioral: Negative for depression. The patient is not nervous/anxious and does not have insomnia.     Past Medical History:  Past Medical History:  Diagnosis Date  . Cervical dysplasia    CIN1 on bx 2014  . Cervical high risk human papillomavirus (HPV) DNA test positive since 2013  . Hyperlipidemia   . Hypertension   . Obesity     Past Surgical History:  Past Surgical History:  Procedure Laterality Date  . COLPOSCOPY    . TUBAL LIGATION      Family History:  Family History  Problem Relation Age of Onset  . Kidney disease Mother   . Hypertension Mother   . Stroke Maternal Grandmother   . Heart disease Maternal Grandmother   . Hypertension Father   . Heart attack Maternal Grandfather   . Cancer Neg Hx   . Diabetes Neg Hx     Social History:  Social History   Social History  . Marital status: Married    Spouse name: N/A  . Number of children: 2  . Years of education: N/A   Occupational History  . Import Specialist    Social History Main Topics  . Smoking status: Never Smoker  . Smokeless tobacco: Never Used  . Alcohol use No  . Drug use: No  . Sexual  activity: Yes    Birth control/ protection: None, Surgical     Comment: tubal ligation   Other Topics Concern  . Not on file   Social History Narrative  . No narrative on file   OB History  Gravida Para Term Preterm AB Living  2 2 1 1   2   SAB TAB Ectopic Multiple Live Births          2    # Outcome Date GA Lbr Len/2nd Weight Sex Delivery Anes PTL Lv  2 Term 02/17/96 [redacted]w[redacted]d  4 lb 10 oz (2.098 kg) F Vag-Spont   LIV     Complications:  Pregnancy induced hypertension,Pregnancy affected by fetal growth restriction  1 Preterm 04/30/92 [redacted]w[redacted]d  4 lb 13 oz (2.183 kg) F Vag-Spont  Y LIV      Allergies:  No Known Allergies  Medications: Prior to Admission medications   Medication Sig Start Date End Date Taking? Authorizing Provider  chlorthalidone (HYGROTON) 25 MG tablet take 1 tablet by mouth once daily 04/23/16   Wynetta Emery, Megan P, DO  gemfibrozil (LOPID) 600 MG tablet Take 1 tablet (600 mg total) by mouth 2 (two) times daily before a meal. 04/04/16   Kathrine Haddock, NP  lisinopril (PRINIVIL,ZESTRIL) 2.5 MG tablet Take 1 tablet (2.5 mg total) by mouth daily. 10/15/16   Kathrine Haddock, NP           Physical Exam Vitals: BP 110/70   Pulse 78   Ht 5\' 3"  (1.6 m)   Wt 177 lb (80.3 kg)   LMP 03/26/2017 (Exact Date)   BMI 31.35 kg/m   General: NAD HEENT: normocephalic, anicteric Neck: no thyroid enlargement, no palpable nodules, no cervical lymphadenopathy  Pulmonary: No increased work of breathing, CTAB Cardiovascular: RRR, without murmur  Breast: Breast symmetrical, no tenderness, no palpable nodules or masses, no skin or nipple retraction present, no nipple discharge.  No axillary, infraclavicular or supraclavicular lymphadenopathy. Abdomen: Soft, non-tender, non-distended.  Umbilicus without lesions.  No hepatomegaly or masses palpable. No evidence of hernia. Genitourinary:  External: Normal external female genitalia.  Normal urethral meatus, normal  Bartholin's and Skene's glands.    Vagina: Normal vaginal mucosa, no evidence of prolapse.    Cervix: Grossly normal in appearance, no bleeding, non-tender  Uterus: Severely retroflexed-unable to palpate much of uterus, does feel irregular posteriorly at isthmus, NT  Adnexa: No adnexal masses, non-tender  Rectal: deferred  Lymphatic: no evidence of inguinal lymphadenopathy Extremities: no edema, erythema, or tenderness Neurologic: Grossly intact Psychiatric: mood  appropriate, affect full     Assessment: 41 y.o. [id number] well woman exam Hx of abnormal Pap smears since / CIN I in 2014  Last Pap ASCUS with positive HRHPV Severely retroflexed uterus: unable to evaluate-offered ultrasound-declines at this time  Plan:    1) Breast cancer screening - recommend monthly self breast exam and annual mammograms. Patient to schedule mammogram at [facility] for after 05/18/2017   2) Cervical cancer screening - Pap was done. ASCCP guidelines and rational discussed.  Long history of abnormal pap smears since 2013, Advised would recommend colposcopy if Pap returns abnormal. Could discuss treatment with cryo or LEEP with physician for persistent abnormal ppa smears and CIN1 on Biopsy  3) Routine healthcare maintenance including cholesterol and diabetes screening managed by PCP.  Dalia Heading, CNM

## 2017-04-07 ENCOUNTER — Encounter: Payer: Self-pay | Admitting: Certified Nurse Midwife

## 2017-04-07 DIAGNOSIS — Z8742 Personal history of other diseases of the female genital tract: Secondary | ICD-10-CM | POA: Insufficient documentation

## 2017-04-07 LAB — IGP, APTIMA HPV
HPV Aptima: POSITIVE — AB
PAP Smear Comment: 0

## 2017-04-08 ENCOUNTER — Telehealth: Payer: Self-pay | Admitting: Certified Nurse Midwife

## 2017-04-08 DIAGNOSIS — R87612 Low grade squamous intraepithelial lesion on cytologic smear of cervix (LGSIL): Secondary | ICD-10-CM

## 2017-04-08 NOTE — Telephone Encounter (Signed)
Called patient to advise of abnormal Pap smear_LGSIL PAP and positive HRHPV. Has had abnormal Pap smears since 2013. Interested in definitive treatment. Will schedule for colposcopy. In addition will get ultrasound: Uterus is severely retroflexed with irregularity posteriorly near isthmus.

## 2017-04-09 ENCOUNTER — Encounter: Payer: Self-pay | Admitting: Unknown Physician Specialty

## 2017-04-09 ENCOUNTER — Other Ambulatory Visit: Payer: Self-pay

## 2017-04-09 ENCOUNTER — Ambulatory Visit (INDEPENDENT_AMBULATORY_CARE_PROVIDER_SITE_OTHER): Payer: 59 | Admitting: Unknown Physician Specialty

## 2017-04-09 VITALS — BP 121/78 | HR 73 | Temp 98.7°F | Ht 62.3 in | Wt 178.3 lb

## 2017-04-09 DIAGNOSIS — Z6830 Body mass index (BMI) 30.0-30.9, adult: Secondary | ICD-10-CM | POA: Diagnosis not present

## 2017-04-09 DIAGNOSIS — F329 Major depressive disorder, single episode, unspecified: Secondary | ICD-10-CM | POA: Diagnosis not present

## 2017-04-09 DIAGNOSIS — I10 Essential (primary) hypertension: Secondary | ICD-10-CM

## 2017-04-09 DIAGNOSIS — R5383 Other fatigue: Secondary | ICD-10-CM

## 2017-04-09 DIAGNOSIS — E785 Hyperlipidemia, unspecified: Secondary | ICD-10-CM | POA: Diagnosis not present

## 2017-04-09 DIAGNOSIS — F321 Major depressive disorder, single episode, moderate: Secondary | ICD-10-CM

## 2017-04-09 DIAGNOSIS — F325 Major depressive disorder, single episode, in full remission: Secondary | ICD-10-CM | POA: Insufficient documentation

## 2017-04-09 DIAGNOSIS — E6609 Other obesity due to excess calories: Secondary | ICD-10-CM

## 2017-04-09 MED ORDER — CHLORTHALIDONE 25 MG PO TABS: 25.0000 mg | ORAL_TABLET | Freq: Every day | ORAL | 3 refills | Status: DC

## 2017-04-09 MED ORDER — BUPROPION HCL ER (SR) 150 MG PO TB12: 150.0000 mg | ORAL_TABLET | Freq: Two times a day (BID) | ORAL | 2 refills | Status: DC

## 2017-04-09 MED ORDER — LISINOPRIL 2.5 MG PO TABS: 2.5000 mg | ORAL_TABLET | Freq: Every day | ORAL | 1 refills | Status: DC

## 2017-04-09 MED ORDER — GEMFIBROZIL 600 MG PO TABS: 600.0000 mg | ORAL_TABLET | Freq: Two times a day (BID) | ORAL | 3 refills | Status: DC

## 2017-04-09 NOTE — Progress Notes (Signed)
BP 121/78   Pulse 73   Temp 98.7 F (37.1 C)   Ht 5' 2.3" (1.582 m)   Wt 178 lb 4.8 oz (80.9 kg)   LMP 03/26/2017 (Exact Date)   SpO2 99%   BMI 32.30 kg/m    Subjective:    Patient ID: Julia Campbell, female    DOB: [birth year], 41 y.o.   MRN: [id number]  HPI: Julia Campbell is a 41 y.o. female  Chief Complaint  Patient presents with  . Hyperlipidemia    6 month f/up  . Hypertension    6 month f/up    Hypertension Using medications without difficulty Average home BPs 120/70   No problems or lightheadedness No chest pain with exertion or shortness of breath No Edema   Hyperlipidemia Using medications without problems: No Muscle aches  Exercise:none  Depression Pt is tearful today.  She thinks much of her depression has to do with her weight.  States she has been feeling depressed for a while but it seems to be getting worse.  Stopped taking Contrave as it stopped working.  She took Xanax years ago for anxiety Depression screen [facility] 2/9 04/09/2017  Decreased Interest 2  Down, Depressed, Hopeless 2  PHQ - 2 Score 4  Altered sleeping 0  Tired, decreased energy 2  Change in appetite 3  Feeling bad or failure about yourself  3  Trouble concentrating 0  Moving slowly or fidgety/restless 0  Suicidal thoughts 0  PHQ-9 Score 12   Family History  Problem Relation Age of Onset  . Kidney disease Mother        Stage 5  . Hypertension Mother   . Stroke Maternal Grandmother   . Heart disease Maternal Grandmother   . Hypertension Father   . Throat cancer Father        late 60s  . Heart attack Maternal Grandfather   . Cancer Neg Hx   . Diabetes Neg Hx    Past Medical History:  Diagnosis Date  . Cervical dysplasia    CIN1 on bx 2014  . Cervical high risk human papillomavirus (HPV) DNA test positive since 2013  . Hyperlipidemia   . Hypertension   . Obesity      Relevant past medical, surgical, family and social history reviewed and updated as indicated. Interim  medical history since our last visit reviewed. Allergies and medications reviewed and updated.  Review of Systems  Per HPI unless specifically indicated above     Objective:    BP 121/78   Pulse 73   Temp 98.7 F (37.1 C)   Ht 5' 2.3" (1.582 m)   Wt 178 lb 4.8 oz (80.9 kg)   LMP 03/26/2017 (Exact Date)   SpO2 99%   BMI 32.30 kg/m   Wt Readings from Last 3 Encounters:  04/09/17 178 lb 4.8 oz (80.9 kg)  04/03/17 177 lb (80.3 kg)  10/09/16 159 lb 12.8 oz (72.5 kg)    Physical Exam  Constitutional: She is oriented to person, place, and time. She appears well-developed and well-nourished. No distress.  HENT:  Head: Normocephalic and atraumatic.  Eyes: Conjunctivae and lids are normal. Right eye exhibits no discharge. Left eye exhibits no discharge. No scleral icterus.  Neck: Normal range of motion. Neck supple. No JVD present. Carotid bruit is not present.  Cardiovascular: Normal rate, regular rhythm and normal heart sounds.   Pulmonary/Chest: Effort normal and breath sounds normal.  Abdominal: Normal appearance. There is no splenomegaly or hepatomegaly.  Musculoskeletal: Normal range of motion.  Neurological: She is alert and oriented to person, place, and time.  Skin: Skin is warm, dry and intact. No rash noted. No pallor.  Psychiatric: She has a normal mood and affect. Her behavior is normal. Judgment and thought content normal.      Assessment & Plan:   Problem List Items Addressed This Visit      Unprioritized   Depression, major, single episode, moderate ([place])    New diagnosis.  Start Buproprion.  Discussed therapy and directions for "Find a therapist" given.        Relevant Medications   buPROPion (WELLBUTRIN SR) 150 MG 12 hr tablet   Essential hypertension - Primary    Stable, continue present medications.        Relevant Orders   Comprehensive metabolic panel   Hyperlipidemia   Relevant Orders   Lipid Panel w/o Chol/HDL Ratio   Obesity    Other Visit  Diagnoses    Fatigue due to depression       Relevant Orders   TSH   VITAMIN D 25 Hydroxy (Vit-D Deficiency, Fractures)   CBC with Differential/Platelet       Follow up plan: Return in about 4 weeks (around 05/07/2017).

## 2017-04-09 NOTE — Patient Instructions (Signed)
Psychology Today> Find a therapist > Limit searches as appropriate

## 2017-04-09 NOTE — Assessment & Plan Note (Signed)
New diagnosis.  Start Buproprion.  Discussed therapy and directions for "Find a therapist" given.

## 2017-04-09 NOTE — Assessment & Plan Note (Signed)
Stable, continue present medications.   

## 2017-04-10 ENCOUNTER — Encounter: Payer: Self-pay | Admitting: Unknown Physician Specialty

## 2017-04-10 LAB — COMPREHENSIVE METABOLIC PANEL
ALT: 9 IU/L (ref 0–32)
AST: 16 IU/L (ref 0–40)
Albumin/Globulin Ratio: 1.5 (ref 1.2–2.2)
Albumin: 4.2 g/dL (ref 3.5–5.5)
Alkaline Phosphatase: 53 IU/L (ref 39–117)
BUN/Creatinine Ratio: 19 (ref 9–23)
BUN: 13 mg/dL (ref 6–24)
Bilirubin Total: 0.4 mg/dL (ref 0.0–1.2)
CO2: 25 mmol/L (ref 20–29)
Calcium: 9.2 mg/dL (ref 8.7–10.2)
Chloride: 98 mmol/L (ref 96–106)
Creatinine, Ser: 0.69 mg/dL (ref 0.57–1.00)
GFR calc Af Amer: 123 mL/min/{1.73_m2} (ref >59)
GFR calc non Af Amer: 107 mL/min/{1.73_m2} (ref >59)
Globulin, Total: 2.8 g/dL (ref 1.5–4.5)
Glucose: 82 mg/dL (ref 65–99)
Potassium: 4 mmol/L (ref 3.5–5.2)
Sodium: 141 mmol/L (ref 134–144)
Total Protein: 7 g/dL (ref 6.0–8.5)

## 2017-04-10 LAB — CBC WITH DIFFERENTIAL/PLATELET
Basophils Absolute: 0.1 10*3/uL (ref 0.0–0.2)
Basos: 1 %
EOS (ABSOLUTE): 0.2 10*3/uL (ref 0.0–0.4)
Eos: 2 %
Hematocrit: 39.3 % (ref 34.0–46.6)
Hemoglobin: 13 g/dL (ref 11.1–15.9)
Immature Grans (Abs): 0 10*3/uL (ref 0.0–0.1)
Immature Granulocytes: 0 %
Lymphocytes Absolute: 3.3 10*3/uL — ABNORMAL HIGH (ref 0.7–3.1)
Lymphs: 31 %
MCH: 30.2 pg (ref 26.6–33.0)
MCHC: 33.1 g/dL (ref 31.5–35.7)
MCV: 91 fL (ref 79–97)
Monocytes Absolute: 0.6 10*3/uL (ref 0.1–0.9)
Monocytes: 6 %
Neutrophils Absolute: 6.3 10*3/uL (ref 1.4–7.0)
Neutrophils: 60 %
Platelets: 425 10*3/uL — ABNORMAL HIGH (ref 150–379)
RBC: 4.31 x10E6/uL (ref 3.77–5.28)
RDW: 14.2 % (ref 12.3–15.4)
WBC: 10.4 10*3/uL (ref 3.4–10.8)

## 2017-04-10 LAB — LIPID PANEL W/O CHOL/HDL RATIO
Cholesterol, Total: 231 mg/dL — ABNORMAL HIGH (ref 100–199)
HDL: 48 mg/dL (ref >39)
LDL Calculated: 129 mg/dL — ABNORMAL HIGH (ref 0–99)
Triglycerides: 270 mg/dL — ABNORMAL HIGH (ref 0–149)
VLDL Cholesterol Cal: 54 mg/dL — ABNORMAL HIGH (ref 5–40)

## 2017-04-10 LAB — VITAMIN D 25 HYDROXY (VIT D DEFICIENCY, FRACTURES): Vit D, 25-Hydroxy: 31.4 ng/mL (ref 30.0–100.0)

## 2017-04-10 LAB — TSH: TSH: 2.21 u[IU]/mL (ref 0.450–4.500)

## 2017-04-15 ENCOUNTER — Other Ambulatory Visit: Payer: Self-pay | Admitting: Unknown Physician Specialty

## 2017-04-25 ENCOUNTER — Other Ambulatory Visit: Payer: Self-pay | Admitting: Certified Nurse Midwife

## 2017-04-25 DIAGNOSIS — Q519 Congenital malformation of uterus and cervix, unspecified: Secondary | ICD-10-CM

## 2017-04-29 ENCOUNTER — Ambulatory Visit (INDEPENDENT_AMBULATORY_CARE_PROVIDER_SITE_OTHER): Payer: 59 | Admitting: Obstetrics & Gynecology

## 2017-04-29 ENCOUNTER — Ambulatory Visit (INDEPENDENT_AMBULATORY_CARE_PROVIDER_SITE_OTHER): Payer: 59

## 2017-04-29 ENCOUNTER — Encounter: Payer: Self-pay | Admitting: Obstetrics & Gynecology

## 2017-04-29 VITALS — BP 130/82 | HR 79 | Ht 62.5 in | Wt 174.0 lb

## 2017-04-29 DIAGNOSIS — D251 Intramural leiomyoma of uterus: Secondary | ICD-10-CM | POA: Diagnosis not present

## 2017-04-29 DIAGNOSIS — Q519 Congenital malformation of uterus and cervix, unspecified: Secondary | ICD-10-CM

## 2017-04-29 NOTE — Progress Notes (Signed)
  HPI: Pt denies heavy bleeding, pain or urinary c/os.  She was noted to have abnormal exam by CLG at her annual, and [name] is to assess tilt size and position of uterus.  Pt also has h/o low grade cervical dysplasia for many years. Last Colpo /Bx was 2014. Recent PAP=LGSIL.  Ultrasound demonstrates 2 cm fibroid, anteverted tilt.   These findings are Pelvis - Fibroid, asymptomatic.  PMHx: She  has a past medical history of Cervical dysplasia; Cervical high risk human papillomavirus (HPV) DNA test positive (since 2013); Hyperlipidemia; Hypertension; and Obesity. Also,  has a past surgical history that includes Tubal ligation and Colposcopy., family history includes Heart attack in her maternal grandfather; Heart disease in her maternal grandmother; Hypertension in her father and mother; Kidney disease in her mother; Stroke in her maternal grandmother; Throat cancer in her father.,  reports that she has never smoked. She has never used smokeless tobacco. She reports that she does not drink alcohol or use drugs.  She has a current medication list which includes the following prescription(s): bupropion, chlorthalidone, gemfibrozil, and lisinopril. Also, has No Known Allergies.  Review of Systems  Constitutional: Negative for chills, fever and malaise/fatigue.  HENT: Negative for congestion, sinus pain and sore throat.   Eyes: Negative for blurred vision and pain.  Respiratory: Negative for cough and wheezing.   Cardiovascular: Negative for chest pain and leg swelling.  Gastrointestinal: Negative for abdominal pain, constipation, diarrhea, heartburn, nausea and vomiting.  Genitourinary: Negative for dysuria, frequency, hematuria and urgency.  Musculoskeletal: Negative for back pain, joint pain, myalgias and neck pain.  Skin: Negative for itching and rash.  Neurological: Negative for dizziness, tremors and weakness.  Endo/Heme/Allergies: Does not bruise/bleed easily.  Psychiatric/Behavioral: Negative  for depression. The patient is not nervous/anxious and does not have insomnia.     Objective: BP 130/82   Pulse 79   Ht 5' 2.5" (1.588 m)   Wt 174 lb (78.9 kg)   LMP 04/24/2017   BMI 31.32 kg/m   Physical examination Constitutional NAD, Conversant  Skin No rashes, lesions or ulceration.   Extremities: Moves all appropriately.  Normal ROM for age. No lymphadenopathy.  Neuro: Grossly intact  Psych: Oriented to PPT.  Normal mood. Normal affect.   Assessment:  Intramural leiomyoma of uterus    No intervention as no sx's.  Monitor based on exam or sx's. Cervical Dysplasia     Keep Colpo appt.  Long discussion regarding tx of persistantly low grade PAPs as well as future monitoring for progression of dysplasia.  A total of 15 minutes were spent face-to-face with the patient during this encounter and over half of that time dealt with counseling and coordination of care.  Barnett Applebaum, MD, [facility], [facility] 04/29/2017  5:11 PM

## 2017-05-10 ENCOUNTER — Encounter: Payer: Self-pay | Admitting: Unknown Physician Specialty

## 2017-05-10 ENCOUNTER — Ambulatory Visit (INDEPENDENT_AMBULATORY_CARE_PROVIDER_SITE_OTHER): Payer: 59 | Admitting: Unknown Physician Specialty

## 2017-05-10 DIAGNOSIS — F321 Major depressive disorder, single episode, moderate: Secondary | ICD-10-CM | POA: Diagnosis not present

## 2017-05-10 DIAGNOSIS — Z1231 Encounter for screening mammogram for malignant neoplasm of breast: Secondary | ICD-10-CM | POA: Diagnosis not present

## 2017-05-10 MED ORDER — BUPROPION HCL ER (SR) 150 MG PO TB12: 150.0000 mg | ORAL_TABLET | Freq: Two times a day (BID) | ORAL | 1 refills | Status: DC

## 2017-05-10 NOTE — Assessment & Plan Note (Addendum)
Pt is doing well with Buproprion.  She is concerned about getting "immune" to it.  Will check in 3 months

## 2017-05-10 NOTE — Progress Notes (Signed)
-    BP 120/84   Pulse 79   Temp 98.6 F (37 C)   Wt 173 lb (78.5 kg)   LMP 04/24/2017   SpO2 98%   BMI 31.14 kg/m    Subjective:    Patient ID: Julia Campbell, female    DOB: Mar 27, 1976, 41 y.o.   MRN: [id number]  HPI: Cybil [place]egal is a 41 y.o. female  Chief Complaint  Patient presents with  . Depression    4 week f/up    Depression Pt is here following up with depression.  We started her on Buproprion Depression screen [facility] 2/9 05/10/2017 04/09/2017  Decreased Interest 0 2  Down, Depressed, Hopeless 0 2  PHQ - 2 Score 0 4  Altered sleeping 0 0  Tired, decreased energy 0 2  Change in appetite 0 3  Feeling bad or failure about yourself  0 3  Trouble concentrating 0 0  Moving slowly or fidgety/restless 0 0  Suicidal thoughts 0 0  PHQ-9 Score 0 12    Relevant past medical, surgical, family and social history reviewed and updated as indicated. Interim medical history since our last visit reviewed. Allergies and medications reviewed and updated.  Review of Systems  Per HPI unless specifically indicated above     Objective:    BP 120/84   Pulse 79   Temp 98.6 F (37 C)   Wt 173 lb (78.5 kg)   LMP 04/24/2017   SpO2 98%   BMI 31.14 kg/m   Wt Readings from Last 3 Encounters:  05/10/17 173 lb (78.5 kg)  04/29/17 174 lb (78.9 kg)  04/09/17 178 lb 4.8 oz (80.9 kg)    Physical Exam  Constitutional: She is oriented to person, place, and time. She appears well-developed and well-nourished. No distress.  HENT:  Head: Normocephalic and atraumatic.  Eyes: Conjunctivae and lids are normal. Right eye exhibits no discharge. Left eye exhibits no discharge. No scleral icterus.  Neck: Normal range of motion. Neck supple. No JVD present. Carotid bruit is not present.  Cardiovascular: Normal rate, regular rhythm and normal heart sounds.   Pulmonary/Chest: Effort normal and breath sounds normal.  Abdominal: Normal appearance. There is no splenomegaly or hepatomegaly.    Musculoskeletal: Normal range of motion.  Neurological: She is alert and oriented to person, place, and time.  Skin: Skin is warm, dry and intact. No rash noted. No pallor.  Psychiatric: She has a normal mood and affect. Her behavior is normal. Judgment and thought content normal.     Assessment & Plan:   Problem List Items Addressed This Visit      Unprioritized   Depression, major, single episode, moderate ([place])    Pt is doing well with Buproprion.  She is concerned about getting "immune" to it.  Will check in 3 months      Relevant Medications   buPROPion (WELLBUTRIN SR) 150 MG 12 hr tablet       Follow up plan: Return in about 3 months (around 08/10/2017).

## 2017-05-13 ENCOUNTER — Encounter: Payer: Self-pay | Admitting: Obstetrics & Gynecology

## 2017-05-13 ENCOUNTER — Other Ambulatory Visit: Payer: Self-pay | Admitting: Obstetrics & Gynecology

## 2017-05-13 ENCOUNTER — Ambulatory Visit (INDEPENDENT_AMBULATORY_CARE_PROVIDER_SITE_OTHER): Payer: 59 | Admitting: Obstetrics & Gynecology

## 2017-05-13 VITALS — BP 100/78 | HR 107 | Ht 62.0 in | Wt 171.0 lb

## 2017-05-13 DIAGNOSIS — R87612 Low grade squamous intraepithelial lesion on cytologic smear of cervix (LGSIL): Secondary | ICD-10-CM | POA: Diagnosis not present

## 2017-05-13 DIAGNOSIS — N870 Mild cervical dysplasia: Secondary | ICD-10-CM | POA: Diagnosis not present

## 2017-05-13 NOTE — Progress Notes (Signed)
HPI:  Julia Campbell is a 41 y.o.  [id number]  who presents today for evaluation and management of abnormal cervical cytology.    Dysplasia History:  2014 LGSIL, Colpo Bx CIN I    2016 and 2017 ASCUS    2018 LGSIL  ROS:  Pertinent items noted in HPI and remainder of comprehensive ROS otherwise negative.  OB History  Gravida Para Term Preterm AB Living  2 2 1 1   2   SAB TAB Ectopic Multiple Live Births          2    # Outcome Date GA Lbr Len/2nd Weight Sex Delivery Anes PTL Lv  2 Term 02/17/96 [redacted]w[redacted]d  4 lb 10 oz (2.098 kg) F Vag-Spont   LIV     Complications: Pregnancy induced hypertension,Pregnancy affected by fetal growth restriction  1 Preterm 04/30/92 [redacted]w[redacted]d  4 lb 13 oz (2.183 kg) F Vag-Spont  Y LIV      Past Medical History:  Diagnosis Date  . Cervical dysplasia    CIN1 on bx 2014  . Cervical high risk human papillomavirus (HPV) DNA test positive since 2013  . Hyperlipidemia   . Hypertension   . LGSIL on Pap smear of cervix 03/2017  . Obesity   . Vaginal high risk HPV DNA test positive 03/2017    Past Surgical History:  Procedure Laterality Date  . COLPOSCOPY    . TUBAL LIGATION      SOCIAL HISTORY: History  Alcohol Use No   History  Drug Use No     Family History  Problem Relation Age of Onset  . Kidney disease Mother        Stage 5  . Hypertension Mother   . Stroke Maternal Grandmother   . Heart disease Maternal Grandmother   . Hypertension Father   . Throat cancer Father        late 88s  . Heart attack Maternal Grandfather   . Cancer Neg Hx   . Diabetes Neg Hx     ALLERGIES:  Patient has no known allergies.  Current Outpatient Prescriptions on File Prior to Visit  Medication Sig Dispense Refill  . buPROPion (WELLBUTRIN SR) 150 MG 12 hr tablet Take 1 tablet (150 mg total) by mouth 2 (two) times daily. 180 tablet 1  . chlorthalidone (HYGROTON) 25 MG tablet take 1 tablet by mouth once daily 90 tablet 3  . gemfibrozil (LOPID) 600 MG tablet Take 1  tablet (600 mg total) by mouth 2 (two) times daily before a meal. 180 tablet 3  . lisinopril (PRINIVIL,ZESTRIL) 2.5 MG tablet Take 1 tablet (2.5 mg total) by mouth daily. 90 tablet 1   No current facility-administered medications on file prior to visit.     Physical Exam: -Vitals:  BP 100/78   Pulse (!) 107   Ht 5\' 2"  (1.575 m)   Wt 171 lb (77.6 kg)   LMP 04/24/2017   BMI 31.28 kg/m  GEN: WD, WN, NAD.  A+ O x 3, good mood and affect. ABD:  NT, ND.  Soft, no masses.  No hernias noted.   Pelvic:   Vulva: Normal appearance.  No lesions.  Vagina: No lesions or abnormalities noted.  Support: Normal pelvic support.  Urethra No masses tenderness or scarring.  Meatus Normal size without lesions or prolapse.  Cervix: See below.  Anus: Normal exam.  No lesions.  Perineum: Normal exam.  No lesions.        Bimanual   Uterus: Normal size.  Non-tender.  Mobile.  AV.  Adnexae: No masses.  Non-tender to palpation.  Cul-de-sac: Negative for abnormality.   PROCEDURE: 1.  Urine Pregnancy Test:  not done 2.  Colposcopy performed with 4% acetic acid after verbal consent obtained                                         -Aceto-white Lesions Location(s): 6 o'clock.              -Biopsy performed at 6/12 o'clock               -ECC indicated and performed: Yes.       -Biopsy sites made hemostatic with pressure, AgNO3, and/or Monsel's solution   -Satisfactory colposcopy: Yes.      -Evidence of Invasive cervical CA :  NO  ASSESSMENT:  Julia Campbell is a 40 y.o. [id number] here for  1. Low grade squamous intraepith lesion on cytologic smear cervix (lgsil)   2. Papanicolaou smear of cervix with low grade squamous intraepithelial lesion (LGSIL)   .  PLAN: 1.  I discussed the grading system of pap smears and HPV high risk viral types.  We will discuss and base management after colpo results return. 2. Follow up PAP 6 months, vs intervention if high grade dysplasia identified 3. Treatment of  persistantly abnormal PAP smears and cervical dysplasia, even mild, is discussed w pt today in detail, as well as the pros and cons of Cryo and LEEP procedures. Will consider and discuss after results.      Barnett Applebaum, MD, [facility], [facility] 05/13/2017  9:04 AM

## 2017-05-13 NOTE — Patient Instructions (Signed)

## 2017-05-15 LAB — PATHOLOGY

## 2017-05-15 NOTE — Progress Notes (Signed)
LM

## 2017-05-16 NOTE — Progress Notes (Signed)
NA

## 2017-05-21 ENCOUNTER — Telehealth: Payer: Self-pay | Admitting: Obstetrics & Gynecology

## 2017-05-21 NOTE — Telephone Encounter (Signed)
-----   Message from Gae Dry, MD sent at 05/21/2017  8:30 AM EDT ----- Regarding: Sch Cryo, get PA Pt needs an appt for CRYO (office procedure visit w [facility]). Please schedule and also forward to [place] to check preauthorization as pt had questions.

## 2017-05-21 NOTE — Progress Notes (Signed)
D/w pt, elects for cervical cryo procedure for recurrent dysplasia since 2014 (CIN I by recent biopsy)

## 2017-05-21 NOTE — Telephone Encounter (Signed)
Sch Cryo, get PA  Received: Today  Message Contents  Gae Dry, MD  P Ws-Admin  Cc: [place]        Pt needs an appt for CRYO (office procedure visit w [place]).  Please schedule and also forward to [place] to check preauthorization as pt had questions.    Routing message to Denyse Dago for scheduling

## 2017-05-23 NOTE — Telephone Encounter (Signed)
Lmtrc

## 2017-05-23 NOTE — Telephone Encounter (Signed)
I spoke to the patient and gave insurance info and costs. Patient will think about it and call back when ready to schedule. Ext given.

## 2017-06-28 NOTE — Telephone Encounter (Signed)
Patient called to let me know she starts a new job on 07/08/17 and will have new insurance 90 days afterwards, a [employer], and would like to know if they would cover the cryosurgery. I let her know that I would be able to check the plan when she receives a subscriber ID and when the policy becomes effective. We discussed costs again, and that the reason she would be responsible for the entire cost w/ the [facility] policy is that her unmet deductible is more than the procedure, and that this may be the case w/ the new policy depending on her deductible amount. Patient understands and decided to wait for the new insurance, stating that whatever she has to pay would at least go toward her deductible for next year. Patient has my ext.

## 2017-06-29 DIAGNOSIS — Z23 Encounter for immunization: Secondary | ICD-10-CM | POA: Diagnosis not present

## 2017-07-03 NOTE — Telephone Encounter (Signed)
Lmtrc

## 2017-07-05 ENCOUNTER — Telehealth: Payer: Self-pay | Admitting: Unknown Physician Specialty

## 2017-07-05 NOTE — Telephone Encounter (Signed)
ERROR

## 2017-08-12 ENCOUNTER — Ambulatory Visit: Payer: 59 | Admitting: Unknown Physician Specialty

## 2017-10-04 ENCOUNTER — Other Ambulatory Visit: Payer: Self-pay

## 2017-10-04 MED ORDER — LISINOPRIL 2.5 MG PO TABS: 2.5000 mg | ORAL_TABLET | Freq: Every day | ORAL | 1 refills | Status: DC

## 2017-10-04 NOTE — Telephone Encounter (Signed)
Patient last seen 04/2017 and has f/up 09/2017.

## 2017-10-14 ENCOUNTER — Encounter: Payer: Self-pay | Admitting: Unknown Physician Specialty

## 2017-10-14 ENCOUNTER — Ambulatory Visit: Payer: BLUE CROSS/BLUE SHIELD | Admitting: Unknown Physician Specialty

## 2017-10-14 DIAGNOSIS — I10 Essential (primary) hypertension: Secondary | ICD-10-CM

## 2017-10-14 DIAGNOSIS — F321 Major depressive disorder, single episode, moderate: Secondary | ICD-10-CM | POA: Diagnosis not present

## 2017-10-14 LAB — MICROALBUMIN, URINE WAIVED
Creatinine, Urine Waived: 200 mg/dL (ref 10–300)
Microalb, Ur Waived: 30 mg/L — ABNORMAL HIGH (ref 0–19)
Microalb/Creat Ratio: <30 mg/g (ref <30)

## 2017-10-14 MED ORDER — BUPROPION HCL ER (XL) 300 MG PO TB24: 300.0000 mg | ORAL_TABLET | Freq: Every day | ORAL | 1 refills | Status: DC

## 2017-10-14 NOTE — Assessment & Plan Note (Addendum)
Doing well with Wellbutrin but not to goal as she was last time.  Will change to 300 mg XL daily to see if it helps with compliance.  Does not want to try a medication that may cause weight gain.

## 2017-10-14 NOTE — Progress Notes (Signed)
BP 120/90   Pulse 76   Temp 98.6 F (37 C) (Oral)   Wt 176 lb (79.8 kg)   SpO2 100%   BMI 32.19 kg/m    Subjective:    Patient ID: Julia Campbell, female    DOB: 1976-07-27, 42 y.o.   MRN: [id number]  HPI: Julia Campbell is a 42 y.o. female  Chief Complaint  Patient presents with  . Depression  . Hypertension  . Medication Refill    pt asked to have any prescriptions printed from today's visit   Depression Taking Wellbutrin.  States she is doing OK with Wellbutrin but not taking the second dose as it gives her a headache at times.   Depression screen [facility] 2/9 10/14/2017 05/10/2017 04/09/2017  Decreased Interest 0 0 2  Down, Depressed, Hopeless 1 0 2  PHQ - 2 Score 1 0 4  Altered sleeping 0 0 0  Tired, decreased energy 0 0 2  Change in appetite 1 0 3  Feeling bad or failure about yourself  1 0 3  Trouble concentrating 0 0 0  Moving slowly or fidgety/restless 0 0 0  Suicidal thoughts 0 0 0  PHQ-9 Score 3 0 12    Hypertension Using medications without difficulty Average home BPs 120-130/70-80   No problems or lightheadedness No chest pain with exertion or shortness of breath No Edema  Relevant past medical, surgical, family and social history reviewed and updated as indicated. Interim medical history since our last visit reviewed. Allergies and medications reviewed and updated.  Review of Systems  Constitutional: Negative.   HENT: Negative.   Eyes: Negative.   Respiratory: Negative.   Gastrointestinal: Negative.   Musculoskeletal: Negative.   Psychiatric/Behavioral: Negative.     Per HPI unless specifically indicated above     Objective:    BP 120/90   Pulse 76   Temp 98.6 F (37 C) (Oral)   Wt 176 lb (79.8 kg)   SpO2 100%   BMI 32.19 kg/m   Wt Readings from Last 3 Encounters:  10/14/17 176 lb (79.8 kg)  05/13/17 171 lb (77.6 kg)  05/10/17 173 lb (78.5 kg)    Physical Exam  Constitutional: She is oriented to person, place, and time. She appears  well-developed and well-nourished. No distress.  HENT:  Head: Normocephalic and atraumatic.  Eyes: Conjunctivae and lids are normal. Right eye exhibits no discharge. Left eye exhibits no discharge. No scleral icterus.  Neck: Normal range of motion. Neck supple. No JVD present. Carotid bruit is not present.  Cardiovascular: Normal rate, regular rhythm and normal heart sounds.  Pulmonary/Chest: Effort normal and breath sounds normal.  Abdominal: Normal appearance. There is no splenomegaly or hepatomegaly.  Musculoskeletal: Normal range of motion.  Neurological: She is alert and oriented to person, place, and time.  Skin: Skin is warm, dry and intact. No rash noted. No pallor.  Psychiatric: She has a normal mood and affect. Her behavior is normal. Judgment and thought content normal.     Assessment & Plan:   Problem List Items Addressed This Visit      Unprioritized   Depression, major, single episode, moderate ([place])    Doing well with Wellbutrin but not to goal as she was last time.  Will change to 300 mg XL daily to see if it helps with compliance.  Does not want to try a medication that may cause weight gain.        Relevant Medications   buPROPion (WELLBUTRIN XL) 300  MG 24 hr tablet   Essential hypertension    Stable, continue present medications. History of Microalbumin so will check CMP       Relevant Orders   Comprehensive metabolic panel   Microalbumin, Urine Waived       Follow up plan: Return in about 3 months (around 01/12/2018).

## 2017-10-14 NOTE — Assessment & Plan Note (Addendum)
Stable, continue present medications. History of Microalbumin so will check CMP

## 2017-10-15 ENCOUNTER — Encounter: Payer: Self-pay | Admitting: Unknown Physician Specialty

## 2017-10-15 LAB — COMPREHENSIVE METABOLIC PANEL
ALT: 11 IU/L (ref 0–32)
AST: 13 IU/L (ref 0–40)
Albumin/Globulin Ratio: 1.8 (ref 1.2–2.2)
Albumin: 4.3 g/dL (ref 3.5–5.5)
Alkaline Phosphatase: 56 IU/L (ref 39–117)
BUN/Creatinine Ratio: 19 (ref 9–23)
BUN: 13 mg/dL (ref 6–24)
Bilirubin Total: 0.3 mg/dL (ref 0.0–1.2)
CO2: 24 mmol/L (ref 20–29)
Calcium: 9.5 mg/dL (ref 8.7–10.2)
Chloride: 100 mmol/L (ref 96–106)
Creatinine, Ser: 0.68 mg/dL (ref 0.57–1.00)
GFR calc Af Amer: 123 mL/min/{1.73_m2} (ref >59)
GFR calc non Af Amer: 107 mL/min/{1.73_m2} (ref >59)
Globulin, Total: 2.4 g/dL (ref 1.5–4.5)
Glucose: 79 mg/dL (ref 65–99)
Potassium: 3.9 mmol/L (ref 3.5–5.2)
Sodium: 140 mmol/L (ref 134–144)
Total Protein: 6.7 g/dL (ref 6.0–8.5)

## 2017-10-17 NOTE — Telephone Encounter (Signed)
Proceed w Cryosurgery to cervix

## 2017-10-17 NOTE — Telephone Encounter (Signed)
Patient called back w/ new [employer] and is scheduled for 11/07/17 @ 8:20am. [employer] and new work# updated in demographics. Patient was given cost, deductible, coinsurance, and out-of-pocket info.  **Dr. Kenton Kingfisher, patient would like to know whether she should have another pap first. Please let me know and if so, I will reschedule. Thanks.

## 2017-11-06 ENCOUNTER — Telehealth: Payer: Self-pay

## 2017-11-06 NOTE — Telephone Encounter (Signed)
Pt states she is scheduled for Cryosurgery tomorrow 11/07/17. She has just started her period, but it is very light. It's only noticeable when she wipes. Inquiring if she should keep or r/s her apt. [phone]

## 2017-11-07 ENCOUNTER — Ambulatory Visit: Payer: 59 | Admitting: Obstetrics & Gynecology

## 2017-11-28 ENCOUNTER — Ambulatory Visit: Payer: BLUE CROSS/BLUE SHIELD | Admitting: Obstetrics & Gynecology

## 2017-11-28 ENCOUNTER — Encounter: Payer: Self-pay | Admitting: Obstetrics & Gynecology

## 2017-11-28 VITALS — BP 128/80 | HR 107 | Ht 62.5 in | Wt 178.0 lb

## 2017-11-28 DIAGNOSIS — N870 Mild cervical dysplasia: Secondary | ICD-10-CM | POA: Diagnosis not present

## 2017-11-28 NOTE — Progress Notes (Signed)
   GYNECOLOGY CLINIC PROCEDURE NOTE  Cryotherapy details  Indication: 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 2014 LGSIL, Colpo Bx CIN I  2016 and 2017 ASCUS  2018 LGSIL  2018 Colpo Bx CIN I  (delay in procedure due to pt scheduling/insurance)  The indications for cryotherapy were reviewed with the patient in detail. She was counseled about that efficacy of this procedure, and possible need for excisional procedure in the future if her cervical dysplasia persists.  The risks of the procedure where explained in detail and patient was told to expect a copious amount of discharge in the next few weeks. All her questions were answered, and written informed consent was obtained.  The patient was placed in the dorsal lithotomy position and a vaginal speculum was placed. Her cervix was visualized and patient was noted to have had normal size transformation zone. The appropriate cryotherapy probes were picked and the more endocervical probe was then affixed to cryotherapy apparatus. Then nitrogen gas was then activated, the probe was applied to the transformation zone of the cervix. This was kept in place for 2 minutes. The cryotherapy was then stopped and all instruments were removed from the patient's pelvis; a thawing period of 2 minutes was observed.  A second cycle of cryotherapy was then administered to the cervix with the more ectocervical probe for 2 minutes.  Then, the probe was removed.  The patient tolerated the procedure well without any complications. Routine post procedure instructions were given to the patient.  Will repeat pap smear in 3 months and manage accordingly.  Barnett Applebaum, MD, [facility], [facility] 11/28/2017  8:40 AM

## 2017-11-28 NOTE — Patient Instructions (Signed)
Cryoablation Cryoablation is a procedure used to remove abnormal growths or cancerous tissue. This is done by freezing the growth or tissue with either liquid nitrogen or argon gas. This procedure may be done to treat many conditions, including:  Skin tumors.  Non-cancerous (benign) nodules.  A type of eye cancer (retinoblastoma).  Cancers of the prostate, liver, kidney, cervix, lung, and bone.  Tell a health care provider about:  Any allergies you have.  All medicines you are taking, including vitamins, herbs, eye drops, creams, and over-the-counter medicines.  Any problems you or family members have had with anesthetic medicines.  Any blood disorders you have.  Any surgeries you have had.  Any medical conditions you have.  Whether you are pregnant or may be pregnant. What are the risks? Generally, this is a safe procedure. However, problems may occur, including:  Infection.  Bleeding.  Swelling.  Nerve damage and loss of feeling. This is rare.  Allergic reactions to medicines.  Damage to other structures or organs.  What happens before the procedure? Staying hydrated Follow instructions from your health care provider about hydration, which may include:  Up to 2 hours before the procedure - you may continue to drink clear liquids, such as water, clear fruit juice, black coffee, and plain tea.  Eating and drinking restrictions Follow instructions from your health care provider about eating and drinking, which may include:  8 hours before the procedure - stop eating heavy meals or foods such as meat, fried foods, or fatty foods.  6 hours before the procedure - stop eating light meals or foods, such as toast or cereal.  6 hours before the procedure - stop drinking milk or drinks that contain milk.  2 hours before the procedure - stop drinking clear liquids.  Medicines  Ask your health care provider about: ? Changing or stopping your regular medicines. This  is especially important if you are taking diabetes medicines or blood thinners. ? Taking medicines such as aspirin and ibuprofen. These medicines can thin your blood. Do not take these medicines before your procedure if your health care provider instructs you not to.  You may be given antibiotic medicine to help prevent infection. General instructions  You may have blood tests.  Plan to have someone take you home from the hospital or clinic.  If you will be going home right after the procedure, plan to have someone with you for 24 hours.  Ask your health care provider how your surgical site will be marked or identified. What happens during the procedure?  To reduce your risk of infection: ? Your health care team will wash or sanitize their hands. ? Your skin will be washed with soap. ? Hair may be removed from the surgical area.  An IV tube will be inserted into one of your veins.  You will be given one or more of the following: ? A medicine to help you relax (sedative). ? A medicine to numb the area (local anesthetic). ? A medicine to make you fall asleep (general anesthetic). ? A medicine that is injected into your spine to numb the area below and slightly above the injection site (spinal anesthetic). ? A medicine that is injected into an area of your body to numb everything below the injection site (regional anesthetic).  Depending on the location of the growth, a small incision may be made. A bronchoscope may be used for a lung tumor, or a laparoscope may be used for an abdominal tumor.  Your  health care provider may use a device (cryoprobe) that has liquid nitrogen or argon gas flowing through it. The cryoprobe will be inserted through the incision to the area where the tumor is located. This may be done with guidance from imaging such as an ultrasound, CT scan, or MRI scan. °· Liquid nitrogen or argon gas will be delivered through the cryoprobe to the growth until it is frozen  and destroyed. °· The process may be repeated on other areas depending on how many areas need treatment. °· The cryoprobe will be removed and pressure will be applied to stop any bleeding. °· The incision will be closed with stitches (sutures). °· The incision will be covered with a bandage (dressing). °The procedure will vary depending on the location of the tumor or nodule. The procedure may also vary among health care providers and hospitals. °What happens after the procedure? °· Do not drive for 24 hours if you received a sedative. °· Your blood pressure, heart rate, breathing rate, and blood oxygen level will be monitored until the medicines you were given have worn off. °· You will be given medicine to help with pain, nausea, and vomiting as needed. °This information is not intended to replace advice given to you by your health care provider. Make sure you discuss any questions you have with your health care provider. °Document Released: 06/24/2013 Document Revised: 03/23/2016 Document Reviewed: 02/01/2016 °Elsevier Interactive Patient Education © 2018 [employer] ° °

## 2018-01-13 ENCOUNTER — Encounter: Payer: Self-pay | Admitting: Unknown Physician Specialty

## 2018-01-13 ENCOUNTER — Ambulatory Visit (INDEPENDENT_AMBULATORY_CARE_PROVIDER_SITE_OTHER): Payer: BLUE CROSS/BLUE SHIELD | Admitting: Unknown Physician Specialty

## 2018-01-13 VITALS — BP 114/76 | HR 72 | Temp 98.2°F | Ht 62.5 in | Wt 163.8 lb

## 2018-01-13 DIAGNOSIS — F325 Major depressive disorder, single episode, in full remission: Secondary | ICD-10-CM

## 2018-01-13 DIAGNOSIS — R634 Abnormal weight loss: Secondary | ICD-10-CM | POA: Diagnosis not present

## 2018-01-13 DIAGNOSIS — E785 Hyperlipidemia, unspecified: Secondary | ICD-10-CM | POA: Diagnosis not present

## 2018-01-13 DIAGNOSIS — I10 Essential (primary) hypertension: Secondary | ICD-10-CM

## 2018-01-13 NOTE — Assessment & Plan Note (Signed)
Check Triglycerides today.  May be down quite a bit with diet changes

## 2018-01-13 NOTE — Assessment & Plan Note (Signed)
Doing well with Wellbutrin XR 300 mg.  Will continue this dose

## 2018-01-13 NOTE — Progress Notes (Signed)
BP 114/76   Pulse 72   Temp 98.2 F (36.8 C) (Oral)   Ht 5' 2.5" (1.588 m)   Wt 163 lb 12.5 oz (74.3 kg)   SpO2 98%   BMI 29.48 kg/m    Subjective:    Patient ID: Julia Campbell, female    DOB: October 02, 1975, 42 y.o.   MRN: [id number]  HPI: Julia Campbell is a 42 y.o. female  Chief Complaint  Patient presents with  . Depression  . Hypertension   Depression Last visit changed to XR version of Wellbutrin for compliance reasons.  Pt states she is doing well with Wellbutrin XR 300 mg.   Depression screen [facility] 2/9 01/13/2018 10/14/2017 05/10/2017 04/09/2017  Decreased Interest 0 0 0 2  Down, Depressed, Hopeless 0 1 0 2  PHQ - 2 Score 0 1 0 4  Altered sleeping 0 0 0 0  Tired, decreased energy 0 0 0 2  Change in appetite 0 1 0 3  Feeling bad or failure about yourself  0 1 0 3  Trouble concentrating 0 0 0 0  Moving slowly or fidgety/restless 0 0 0 0  Suicidal thoughts 0 0 0 0  PHQ-9 Score 0 3 0 12   Weight loss Pt with a 15 pound weight loss.  This is purposeful and going to weight watchers.    Hyperlipidemia Taking Gemfibrozole.  But, made significant diet changes  Hypertension Using medications without difficulty Average home BPs: Not chekcing  No problems or lightheadedness No chest pain with exertion or shortness of breath No Edema   Relevant past medical, surgical, family and social history reviewed and updated as indicated. Interim medical history since our last visit reviewed. Allergies and medications reviewed and updated.  Review of Systems  Constitutional: Negative.   HENT: Negative.   Gastrointestinal: Negative.   Genitourinary: Negative.     Per HPI unless specifically indicated above     Objective:    BP 114/76   Pulse 72   Temp 98.2 F (36.8 C) (Oral)   Ht 5' 2.5" (1.588 m)   Wt 163 lb 12.5 oz (74.3 kg)   SpO2 98%   BMI 29.48 kg/m   Wt Readings from Last 3 Encounters:  01/13/18 163 lb 12.5 oz (74.3 kg)  11/28/17 178 lb (80.7 kg)  10/14/17  176 lb (79.8 kg)    Physical Exam  Constitutional: She is oriented to person, place, and time. She appears well-developed and well-nourished.  HENT:  Head: Normocephalic and atraumatic.  Eyes: Pupils are equal, round, and reactive to light. Right eye exhibits no discharge. Left eye exhibits no discharge. No scleral icterus.  Neck: Normal range of motion. Neck supple. Carotid bruit is not present. No thyromegaly present.  Cardiovascular: Normal rate, regular rhythm and normal heart sounds. Exam reveals no gallop and no friction rub.  No murmur heard. Pulmonary/Chest: Effort normal and breath sounds normal. No respiratory distress. She has no wheezes. She has no rales. No breast tenderness or discharge.  Abdominal: Soft. Bowel sounds are normal. There is no tenderness. There is no rebound.  Genitourinary: No breast tenderness or discharge.  Musculoskeletal: Normal range of motion.  Lymphadenopathy:    She has no cervical adenopathy.  Neurological: She is alert and oriented to person, place, and time.  Skin: Skin is warm, dry and intact. No rash noted.  Psychiatric: She has a normal mood and affect. Her speech is normal and behavior is normal. Judgment and thought content normal. Cognition and memory  are normal.    Results for orders placed or performed in visit on 10/14/17  Comprehensive metabolic panel  Result Value Ref Range   Glucose 79 65 - 99 mg/dL   BUN 13 6 - 24 mg/dL   Creatinine, Ser 0.68 0.57 - 1.00 mg/dL   GFR calc non Af Amer 107 >59 mL/min/1.73   GFR calc Af Amer 123 >59 mL/min/1.73   BUN/Creatinine Ratio 19 9 - 23   Sodium 140 134 - 144 mmol/L   Potassium 3.9 3.5 - 5.2 mmol/L   Chloride 100 96 - 106 mmol/L   CO2 24 20 - 29 mmol/L   Calcium 9.5 8.7 - 10.2 mg/dL   Total Protein 6.7 6.0 - 8.5 g/dL   Albumin 4.3 3.5 - 5.5 g/dL   Globulin, Total 2.4 1.5 - 4.5 g/dL   Albumin/Globulin Ratio 1.8 1.2 - 2.2   Bilirubin Total 0.3 0.0 - 1.2 mg/dL   Alkaline Phosphatase 56 39 -  117 IU/L   AST 13 0 - 40 IU/L   ALT 11 0 - 32 IU/L  Microalbumin, Urine Waived  Result Value Ref Range   Microalb, Ur Waived 30 (H) 0 - 19 mg/L   Creatinine, Urine Waived 200 10 - 300 mg/dL   Microalb/Creat Ratio <30 <30 mg/g      Assessment & Plan:   Problem List Items Addressed This Visit      Unprioritized   Depression, major, single episode, complete remission ([place])    Doing well with Wellbutrin XR 300 mg.  Will continue this dose      Essential hypertension    Continue to monitor BP with weight loss.  Recheck in 3 months to consider titrating medications with diet changes      Hyperlipidemia    Check Triglycerides today.  May be down quite a bit with diet changes      Relevant Orders   Lipid Panel w/o Chol/HDL Ratio   Comprehensive metabolic panel    Other Visit Diagnoses    Weight loss    -  Primary   Pt states that she is losing weight through weight watchers.         Follow up plan: Return in about 3 months (around 04/14/2018).

## 2018-01-13 NOTE — Assessment & Plan Note (Signed)
Continue to monitor BP with weight loss.  Recheck in 3 months to consider titrating medications with diet changes

## 2018-01-14 ENCOUNTER — Encounter: Payer: Self-pay | Admitting: Unknown Physician Specialty

## 2018-01-14 LAB — LIPID PANEL W/O CHOL/HDL RATIO
Cholesterol, Total: 226 mg/dL — ABNORMAL HIGH (ref 100–199)
HDL: 53 mg/dL (ref >39)
LDL Calculated: 144 mg/dL — ABNORMAL HIGH (ref 0–99)
Triglycerides: 144 mg/dL (ref 0–149)
VLDL Cholesterol Cal: 29 mg/dL (ref 5–40)

## 2018-01-14 LAB — COMPREHENSIVE METABOLIC PANEL
ALT: 15 IU/L (ref 0–32)
AST: 17 IU/L (ref 0–40)
Albumin/Globulin Ratio: 1.9 (ref 1.2–2.2)
Albumin: 4.7 g/dL (ref 3.5–5.5)
Alkaline Phosphatase: 58 IU/L (ref 39–117)
BUN/Creatinine Ratio: 14 (ref 9–23)
BUN: 11 mg/dL (ref 6–24)
Bilirubin Total: 0.4 mg/dL (ref 0.0–1.2)
CO2: 22 mmol/L (ref 20–29)
Calcium: 9.5 mg/dL (ref 8.7–10.2)
Chloride: 99 mmol/L (ref 96–106)
Creatinine, Ser: 0.77 mg/dL (ref 0.57–1.00)
GFR calc Af Amer: 109 mL/min/{1.73_m2} (ref >59)
GFR calc non Af Amer: 94 mL/min/{1.73_m2} (ref >59)
Globulin, Total: 2.5 g/dL (ref 1.5–4.5)
Glucose: 86 mg/dL (ref 65–99)
Potassium: 3.4 mmol/L — ABNORMAL LOW (ref 3.5–5.2)
Sodium: 140 mmol/L (ref 134–144)
Total Protein: 7.2 g/dL (ref 6.0–8.5)

## 2018-02-28 ENCOUNTER — Encounter: Payer: Self-pay | Admitting: Obstetrics & Gynecology

## 2018-02-28 ENCOUNTER — Ambulatory Visit: Payer: BLUE CROSS/BLUE SHIELD | Admitting: Obstetrics & Gynecology

## 2018-02-28 VITALS — BP 120/80 | Ht 62.5 in | Wt 156.0 lb

## 2018-02-28 DIAGNOSIS — N870 Mild cervical dysplasia: Secondary | ICD-10-CM | POA: Diagnosis not present

## 2018-02-28 NOTE — Progress Notes (Signed)
HPI:  Patient is a 42 y.o. [id number] presenting for follow up evaluation of abnormal PAP smear in the past.   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 2014 LGSIL, Colpo Bx CIN I 2016 and 2017 ASCUS 2018 LGSIL  2018 Colpo Bx CIN I Cryo 3 mos ago (11/2017)  PMHx: She  has a past medical history of Cervical dysplasia, Cervical high risk human papillomavirus (HPV) DNA test positive (since 2013), Hyperlipidemia, Hypertension, LGSIL on Pap smear of cervix (03/2017), Obesity, and Vaginal high risk HPV DNA test positive (03/2017). Also,  has a past surgical history that includes Tubal ligation and Colposcopy., family history includes Heart attack in her maternal grandfather; Heart disease in her maternal grandmother; Hypertension in her father and mother; Kidney disease in her mother; Stroke in her maternal grandmother; Throat cancer in her father.,  reports that she has never smoked. She has never used smokeless tobacco. She reports that she does not drink alcohol or use drugs.  She has a current medication list which includes the following prescription(s): bupropion, chlorthalidone, gemfibrozil, and lisinopril. Also, has No Known Allergies.  Review of Systems  Constitutional: Negative for chills, fever and malaise/fatigue.  HENT: Negative for congestion, sinus pain and sore throat.   Eyes: Negative for blurred vision and pain.  Respiratory: Negative for cough and wheezing.   Cardiovascular: Negative for chest pain and leg swelling.  Gastrointestinal: Negative for abdominal pain, constipation, diarrhea, heartburn, nausea and vomiting.  Genitourinary: Negative for dysuria, frequency, hematuria and urgency.  Musculoskeletal: Negative for back pain, joint pain, myalgias and neck pain.  Skin: Negative for itching and rash.  Neurological: Negative for dizziness, tremors and weakness.    Endo/Heme/Allergies: Does not bruise/bleed easily.  Psychiatric/Behavioral: Negative for depression. The patient is not nervous/anxious and does not have insomnia.     Objective: BP 120/80   Ht 5' 2.5" (1.588 m)   Wt 156 lb (70.8 kg)   BMI 28.08 kg/m  Filed Weights   02/28/18 0801  Weight: 156 lb (70.8 kg)   Body mass index is 28.08 kg/m.  Physical examination Physical Exam  Constitutional: She is oriented to person, place, and time. She appears well-developed and well-nourished. No distress.  Genitourinary: Vagina normal and uterus normal. Pelvic exam was performed with patient supine. There is no rash, tenderness or lesion on the right labia. There is no rash, tenderness or lesion on the left labia. No erythema or bleeding in the vagina. Right adnexum does not display mass and does not display tenderness. Left adnexum does not display mass and does not display tenderness. Cervix does not exhibit motion tenderness, discharge, polyp or nabothian cyst.   Uterus is mobile and midaxial. Uterus is not enlarged or exhibiting a mass.  HENT:  Head: Normocephalic and atraumatic.  Nose: Nose normal.  Mouth/Throat: Oropharynx is clear and moist.  Abdominal: Soft. She exhibits no distension. There is no tenderness.  Musculoskeletal: Normal range of motion.  Neurological: She is alert and oriented to person, place, and time. No cranial nerve deficit.  Skin: Skin is warm and dry.  Psychiatric: She has a normal mood and affect.   ASSESSMENT:  History of Cervical Dysplasia  Plan:  1. I discussed the grading system of pap smears and HPV high risk viral types.   2. Follow up PAP 6 months, vs intervention if high grade dysplasia identified. 3. S/P Cryo, will follow w PAP now, 7mos until  normal pattern.  LEEP as next treatment option if high grade.  Barnett Applebaum, MD, [facility], [facility] 02/28/2018  8:08 AM

## 2018-03-04 LAB — PAP IG (IMAGE GUIDED): PAP Smear Comment: 0

## 2018-03-17 ENCOUNTER — Telehealth: Payer: Self-pay

## 2018-03-17 NOTE — Telephone Encounter (Signed)
Pt calling for pap results.  [phone]  Pt aware pap negative.

## 2018-04-04 ENCOUNTER — Other Ambulatory Visit: Payer: Self-pay | Admitting: Unknown Physician Specialty

## 2018-04-14 ENCOUNTER — Ambulatory Visit (INDEPENDENT_AMBULATORY_CARE_PROVIDER_SITE_OTHER): Payer: BLUE CROSS/BLUE SHIELD | Admitting: Unknown Physician Specialty

## 2018-04-14 ENCOUNTER — Encounter: Payer: Self-pay | Admitting: Unknown Physician Specialty

## 2018-04-14 DIAGNOSIS — E785 Hyperlipidemia, unspecified: Secondary | ICD-10-CM

## 2018-04-14 DIAGNOSIS — I10 Essential (primary) hypertension: Secondary | ICD-10-CM | POA: Diagnosis not present

## 2018-04-14 DIAGNOSIS — F325 Major depressive disorder, single episode, in full remission: Secondary | ICD-10-CM | POA: Diagnosis not present

## 2018-04-14 NOTE — Progress Notes (Signed)
BP 109/73   Pulse 66   Temp 98.3 F (36.8 C) (Oral)   Ht 5' 2.5" (1.588 m)   Wt 151 lb 3.2 oz (68.6 kg)   SpO2 100%   BMI 27.21 kg/m    Subjective:    Patient ID: Julia Campbell, female    DOB: [birth year], 42 y.o.   MRN: [id number]  HPI: Julia Campbell is a 42 y.o. female  Chief Complaint  Patient presents with  . Hypertension  Hypertension Using medications without difficulty Average home BPs   No problems or lightheadedness No chest pain with exertion or shortness of breath No Edema  Hyperlipidemia Using medications without problems.  On Lopid No Muscle aches  Diet compliance: In weight watchers with purposeful weight loss Exercise: Occasionally  Depression Taking Wellbutrin and doing well.  Denies depression at this time Depression screen [facility] 2/9 04/14/2018 01/13/2018 10/14/2017 05/10/2017 04/09/2017  Decreased Interest 0 0 0 0 2  Down, Depressed, Hopeless 0 0 1 0 2  PHQ - 2 Score 0 0 1 0 4  Altered sleeping 0 0 0 0 0  Tired, decreased energy 0 0 0 0 2  Change in appetite 0 0 1 0 3  Feeling bad or failure about yourself  0 0 1 0 3  Trouble concentrating 0 0 0 0 0  Moving slowly or fidgety/restless 0 0 0 0 0  Suicidal thoughts 0 0 0 0 0  PHQ-9 Score 0 0 3 0 12  Difficult doing work/chores Not difficult at all - - - -      Relevant past medical, surgical, family and social history reviewed and updated as indicated. Interim medical history since our last visit reviewed. Allergies and medications reviewed and updated.  Review of Systems  Per HPI unless specifically indicated above     Objective:    BP 109/73   Pulse 66   Temp 98.3 F (36.8 C) (Oral)   Ht 5' 2.5" (1.588 m)   Wt 151 lb 3.2 oz (68.6 kg)   SpO2 100%   BMI 27.21 kg/m   Wt Readings from Last 3 Encounters:  04/14/18 151 lb 3.2 oz (68.6 kg)  02/28/18 156 lb (70.8 kg)  01/13/18 163 lb 12.5 oz (74.3 kg)    Physical Exam  Constitutional: She is oriented to person, place, and time. She  appears well-developed and well-nourished. No distress.  HENT:  Head: Normocephalic and atraumatic.  Eyes: Conjunctivae and lids are normal. Right eye exhibits no discharge. Left eye exhibits no discharge. No scleral icterus.  Neck: Normal range of motion. Neck supple. No JVD present. Carotid bruit is not present.  Cardiovascular: Normal rate, regular rhythm and normal heart sounds.  Pulmonary/Chest: Effort normal and breath sounds normal.  Abdominal: Normal appearance. There is no splenomegaly or hepatomegaly.  Musculoskeletal: Normal range of motion.  Neurological: She is alert and oriented to person, place, and time.  Skin: Skin is warm, dry and intact. No rash noted. No pallor.  Psychiatric: She has a normal mood and affect. Her behavior is normal. Judgment and thought content normal.    Results for orders placed or performed in visit on 02/28/18  Pap IG (Image Guided)  Result Value Ref Range   DIAGNOSIS: Comment    Specimen adequacy: Comment    Clinician Provided ICD10 Comment    Performed by: Comment    PAP Smear Comment .    Note: Comment    Test Methodology Comment       Assessment &  Plan:   Problem List Items Addressed This Visit      Unprioritized   Depression, major, single episode, complete remission ([place])    Doing well.  Will consider decreasing Wellbutrin once she meets her weight loss goals      Essential hypertension    BP at goal with weight loss.  Will stop Lisinopril      Hyperlipidemia    Not fasting today.  Will continue medications but recheck in 3 months and will come in fasting         Consider tapering other medications in 3 months  Follow up plan: Return in about 3 months (around 07/15/2018).

## 2018-04-14 NOTE — Assessment & Plan Note (Signed)
BP at goal with weight loss.  Will stop Lisinopril

## 2018-04-14 NOTE — Assessment & Plan Note (Signed)
Not fasting today.  Will continue medications but recheck in 3 months and will come in fasting

## 2018-04-14 NOTE — Assessment & Plan Note (Signed)
Doing well.  Will consider decreasing Wellbutrin once she meets her weight loss goals

## 2018-05-05 ENCOUNTER — Other Ambulatory Visit: Payer: Self-pay | Admitting: Unknown Physician Specialty

## 2018-05-06 ENCOUNTER — Other Ambulatory Visit: Payer: Self-pay | Admitting: Unknown Physician Specialty

## 2018-05-22 ENCOUNTER — Telehealth: Payer: Self-pay | Admitting: Unknown Physician Specialty

## 2018-05-22 ENCOUNTER — Encounter: Payer: Self-pay | Admitting: Unknown Physician Specialty

## 2018-05-22 NOTE — Telephone Encounter (Signed)
Opened in error

## 2018-06-13 ENCOUNTER — Telehealth: Payer: Self-pay | Admitting: Unknown Physician Specialty

## 2018-06-13 NOTE — Telephone Encounter (Signed)
-----   Message from Jill Side sent at 04/14/2018  9:29 AM EDT ----- Return in about 3 months (around 07/15/2018). With jolene early morning

## 2018-06-13 NOTE — Telephone Encounter (Signed)
LVM and printed letter 05/22/18. LVM for pt to call back to schedule an appt after 07/15/18.

## 2018-07-13 ENCOUNTER — Other Ambulatory Visit: Payer: Self-pay | Admitting: Unknown Physician Specialty

## 2018-07-14 NOTE — Telephone Encounter (Signed)
Refill request approved for Wellbutrin.

## 2018-07-14 NOTE — Telephone Encounter (Signed)
Requested medication (s) are due for refill today: yes  Requested medication (s) are on the active medication list: yes  Last refill:  04/04/18  Future visit scheduled: no  Notes to clinic:  LOV 04/14/18  Requested Prescriptions  Pending Prescriptions Disp Refills   buPROPion (WELLBUTRIN XL) 300 MG 24 hr tablet [Pharmacy Med Name: BUPROPION HCL XL 300 MG TABLET] 30 tablet 2    Sig: TAKE 1 TABLET BY MOUTH EVERY DAY     Psychiatry: Antidepressants - bupropion Passed - 07/13/2018 10:59 AM      Passed - Completed PHQ-2 or PHQ-9 in the last 360 days.      Passed - Last BP in normal range    BP Readings from Last 1 Encounters:  04/14/18 109/73         Passed - Valid encounter within last 6 months    Recent Outpatient Visits          3 months ago Essential hypertension   [facility] Kathrine Haddock, NP   6 months ago Weight loss   [facility] Kathrine Haddock, NP   9 months ago Essential hypertension   [facility] Kathrine Haddock, NP   1 year ago Depression, major, single episode, moderate ([place])   [place] Kathrine Haddock, NP   1 year ago Essential hypertension   [facility] Kathrine Haddock, NP

## 2018-09-01 ENCOUNTER — Ambulatory Visit (INDEPENDENT_AMBULATORY_CARE_PROVIDER_SITE_OTHER): Payer: 59 | Admitting: Nurse Practitioner

## 2018-09-01 ENCOUNTER — Other Ambulatory Visit: Payer: Self-pay

## 2018-09-01 ENCOUNTER — Encounter: Payer: Self-pay | Admitting: Nurse Practitioner

## 2018-09-01 ENCOUNTER — Ambulatory Visit: Payer: BLUE CROSS/BLUE SHIELD | Admitting: Obstetrics & Gynecology

## 2018-09-01 VITALS — BP 122/83 | HR 82 | Temp 98.2°F | Ht 62.5 in | Wt 146.0 lb

## 2018-09-01 DIAGNOSIS — E785 Hyperlipidemia, unspecified: Secondary | ICD-10-CM

## 2018-09-01 DIAGNOSIS — F325 Major depressive disorder, single episode, in full remission: Secondary | ICD-10-CM

## 2018-09-01 DIAGNOSIS — I10 Essential (primary) hypertension: Secondary | ICD-10-CM

## 2018-09-01 NOTE — Assessment & Plan Note (Signed)
Chronic, stable.  Not interested in GDR at this time.  Continue to monitor and reduce medication as appropriate and in collaboration with patient.

## 2018-09-01 NOTE — Assessment & Plan Note (Signed)
Chronic, with stable BP at goal with recent weight loss and medication.  Continue current medication regimen and adjust as needed.

## 2018-09-01 NOTE — Assessment & Plan Note (Signed)
Chronic, ongoing.  Continue Lopid.  Fasting lipid panel today and CMP.

## 2018-09-01 NOTE — Patient Instructions (Signed)
Fat and Cholesterol Restricted Diet Getting too much fat and cholesterol in your diet may cause health problems. Following this diet helps keep your fat and cholesterol at normal levels. This can keep you from getting sick. What types of fat should I choose?  Choose monosaturated and polyunsaturated fats. These are found in foods such as olive oil, canola oil, flaxseeds, walnuts, almonds, and seeds.  Eat more omega-3 fats. Good choices include salmon, mackerel, sardines, tuna, flaxseed oil, and ground flaxseeds.  Limit saturated fats. These are in animal products such as meats, butter, and cream. They can also be in plant products such as palm oil, palm kernel oil, and coconut oil.  Avoid foods with partially hydrogenated oils in them. These contain trans fats. Examples of foods that have trans fats are stick margarine, some tub margarines, cookies, crackers, and other baked goods. What general guidelines do I need to follow?  Check food labels. Look for the words "trans fat" and "saturated fat."  When preparing a meal: ? Fill half of your plate with vegetables and green salads. ? Fill one fourth of your plate with whole grains. Look for the word "whole" as the first word in the ingredient list. ? Fill one fourth of your plate with lean protein foods.  Eat more foods that have fiber, like apples, carrots, beans, peas, and barley.  Eat more home-cooked foods. Eat less at restaurants and buffets.  Limit or avoid alcohol.  Limit foods high in starch and sugar.  Limit fried foods.  Cook foods without frying them. Baking, boiling, grilling, and broiling are all great options.  Lose weight if you are overweight. Losing even a small amount of weight can help your overall health. It can also help prevent diseases such as diabetes and heart disease. What foods can I eat? Grains Whole grains, such as whole wheat or whole grain breads, crackers, cereals, and pasta. Unsweetened oatmeal,  bulgur, barley, quinoa, or brown rice. Corn or whole wheat flour tortillas. Vegetables Fresh or frozen vegetables (raw, steamed, roasted, or grilled). Green salads. Fruits All fresh, canned (in natural juice), or frozen fruits. Meat and Other Protein Products Ground beef (85% or leaner), grass-fed beef, or beef trimmed of fat. Skinless chicken or turkey. Ground chicken or turkey. Pork trimmed of fat. All fish and seafood. Eggs. Dried beans, peas, or lentils. Unsalted nuts or seeds. Unsalted canned or dry beans. Dairy Low-fat dairy products, such as skim or 1% milk, 2% or reduced-fat cheeses, low-fat ricotta or cottage cheese, or plain low-fat yogurt. Fats and Oils Tub margarines without trans fats. Light or reduced-fat mayonnaise and salad dressings. Avocado. Olive, canola, sesame, or safflower oils. Natural peanut or almond butter (choose ones without added sugar and oil). The items listed above may not be a complete list of recommended foods or beverages. Contact your dietitian for more options. What foods are not recommended? Grains White bread. White pasta. White rice. Cornbread. Bagels, pastries, and croissants. Crackers that contain trans fat. Vegetables White potatoes. Corn. Creamed or fried vegetables. Vegetables in a cheese sauce. Fruits Dried fruits. Canned fruit in light or heavy syrup. Fruit juice. Meat and Other Protein Products Fatty cuts of meat. Ribs, chicken wings, bacon, sausage, bologna, salami, chitterlings, fatback, hot dogs, bratwurst, and packaged luncheon meats. Liver and organ meats. Dairy Whole or 2% milk, cream, half-and-half, and cream cheese. Whole milk cheeses. Whole-fat or sweetened yogurt. Full-fat cheeses. Nondairy creamers and whipped toppings. Processed cheese, cheese spreads, or cheese curds. Sweets and Desserts Corn   syrup, sugars, honey, and molasses. Candy. Jam and jelly. Syrup. Sweetened cereals. Cookies, pies, cakes, donuts, muffins, and ice  cream. Fats and Oils Butter, stick margarine, lard, shortening, ghee, or bacon fat. Coconut, palm kernel, or palm oils. Beverages Alcohol. Sweetened drinks (such as sodas, lemonade, and fruit drinks or punches). The items listed above may not be a complete list of foods and beverages to avoid. Contact your dietitian for more information. This information is not intended to replace advice given to you by your health care provider. Make sure you discuss any questions you have with your health care provider. Document Released: 03/04/2012 Document Revised: 05/10/2016 Document Reviewed: 12/03/2013 Elsevier Interactive Patient Education  2018 Elsevier Inc.  

## 2018-09-01 NOTE — Progress Notes (Signed)
BP 122/83   Pulse 82   Temp 98.2 F (36.8 C) (Oral)   Ht 5' 2.5" (1.588 m)   Wt 146 lb (66.2 kg)   SpO2 98%   BMI 26.28 kg/m    Subjective:    Patient ID: Julia Campbell, female    DOB: 11/11/1975, 42 y.o.   MRN: [id number]  HPI: Julia Campbell is a 42 y.o. female presents for HTN/HLD and depression follow-up  Chief Complaint  Patient presents with  . Depression    f/u meds  . Hypertension  . Hyperlipidemia   HYPERTENSION / HYPERLIPIDEMIA Continues on Lopid and [place] without ADR.  Reports that if cholesterol panel is improved she is interested in reducing her cholesterol medication.  Has been working on diet and weight loss at home with loss of 10 pounds since June. Satisfied with current treatment? yes Duration of hypertension: chronic BP monitoring frequency: rarely BP range: 120/80 BP medication side effects: no Duration of hyperlipidemia: chronic Cholesterol medication side effects: no Cholesterol supplements: none Medication compliance: excellent compliance Aspirin: no Recent stressors: no Recurrent headaches: no Visual changes: no Palpitations: no Dyspnea: no Chest pain: no Lower extremity edema: no Dizzy/lightheaded: no  DEPRESSION Continues on Wellbutrin with stable mood.  Not interested in GDR at this time.  Discussed risks/benefits of medication regimen with patient. Mood status: controlled Satisfied with current treatment?: yes Symptom severity: mild  Duration of current treatment : chronic Side effects: no Medication compliance: good compliance Psychotherapy/counseling: none Previous psychiatric medications: none Depressed mood: no Anxious mood: no Anhedonia: no Significant weight loss or gain: no Insomnia:none Fatigue: no Feelings of worthlessness or guilt: no Impaired concentration/indecisiveness: no Suicidal ideations: no Hopelessness: no Crying spells: no Depression screen [facility] 2/9 09/01/2018 04/14/2018 01/13/2018 10/14/2017 05/10/2017    Decreased Interest 0 0 0 0 0  Down, Depressed, Hopeless 0 0 0 1 0  PHQ - 2 Score 0 0 0 1 0  Altered sleeping 0 0 0 0 0  Tired, decreased energy 0 0 0 0 0  Change in appetite 0 0 0 1 0  Feeling bad or failure about yourself  0 0 0 1 0  Trouble concentrating 0 0 0 0 0  Moving slowly or fidgety/restless 0 0 0 0 0  Suicidal thoughts 0 0 0 0 0  PHQ-9 Score 0 0 0 3 0  Difficult doing work/chores Not difficult at all Not difficult at all - - -    Relevant past medical, surgical, family and social history reviewed and updated as indicated. Interim medical history since our last visit reviewed. Allergies and medications reviewed and updated.  Review of Systems  Constitutional: Negative for activity change, appetite change, diaphoresis, fatigue and fever.  Respiratory: Negative for cough, chest tightness and shortness of breath.   Cardiovascular: Negative for chest pain, palpitations and leg swelling.  Gastrointestinal: Negative for abdominal distention, abdominal pain, constipation, diarrhea, nausea and vomiting.  Endocrine: Negative.   Neurological: Negative for dizziness, syncope, weakness, light-headedness, numbness and headaches.  Psychiatric/Behavioral: Negative for behavioral problems, decreased concentration, self-injury, sleep disturbance and suicidal ideas. The patient is not nervous/anxious.     Per HPI unless specifically indicated above     Objective:    BP 122/83   Pulse 82   Temp 98.2 F (36.8 C) (Oral)   Ht 5' 2.5" (1.588 m)   Wt 146 lb (66.2 kg)   SpO2 98%   BMI 26.28 kg/m   Wt Readings from Last 3 Encounters:  09/01/18 146  lb (66.2 kg)  04/14/18 151 lb 3.2 oz (68.6 kg)  02/28/18 156 lb (70.8 kg)    Physical Exam Vitals signs and nursing note reviewed.  Constitutional:      Appearance: She is well-developed.  HENT:     Head: Normocephalic.  Eyes:     General:        Right eye: No discharge.        Left eye: No discharge.     Conjunctiva/sclera:  Conjunctivae normal.     Pupils: Pupils are equal, round, and reactive to light.  Neck:     Musculoskeletal: Normal range of motion and neck supple.     Thyroid: No thyromegaly.     Vascular: No carotid bruit or JVD.  Cardiovascular:     Rate and Rhythm: Normal rate and regular rhythm.     Heart sounds: Normal heart sounds.  Pulmonary:     Effort: Pulmonary effort is normal.     Breath sounds: Normal breath sounds.  Abdominal:     General: Bowel sounds are normal.     Palpations: Abdomen is soft.  Lymphadenopathy:     Cervical: No cervical adenopathy.  Skin:    General: Skin is warm and dry.  Neurological:     Mental Status: She is alert and oriented to person, place, and time.  Psychiatric:        Mood and Affect: Mood normal.        Speech: Speech normal.        Behavior: Behavior normal.        Thought Content: Thought content normal.        Judgment: Judgment normal.     Results for orders placed or performed in visit on 02/28/18  Pap IG (Image Guided)  Result Value Ref Range   DIAGNOSIS: Comment    Specimen adequacy: Comment    Clinician Provided ICD10 Comment    Performed by: Comment    PAP Smear Comment .    Note: Comment    Test Methodology Comment       Assessment & Plan:   Problem List Items Addressed This Visit      Cardiovascular and Mediastinum   Essential hypertension - Primary    Chronic, with stable BP at goal with recent weight loss and medication.  Continue current medication regimen and adjust as needed.      Relevant Orders   Comprehensive metabolic panel     Other   Hyperlipidemia    Chronic, ongoing.  Continue Lopid.  Fasting lipid panel today and CMP.      Relevant Orders   Comprehensive metabolic panel   Lipid Panel w/o Chol/HDL Ratio   Depression, major, single episode, complete remission ([facility])    Chronic, stable.  Not interested in GDR at this time.  Continue to monitor and reduce medication as appropriate and in collaboration  with patient.         Time: 25 minutes, >50% spent counseling on Wellbutrin and dose reduction Follow up plan: Return in about 6 months (around 03/03/2019) for HTN/HLd & depression.

## 2018-09-02 LAB — LIPID PANEL W/O CHOL/HDL RATIO
Cholesterol, Total: 226 mg/dL — ABNORMAL HIGH (ref 100–199)
HDL: 65 mg/dL (ref >39)
LDL Calculated: 134 mg/dL — ABNORMAL HIGH (ref 0–99)
Triglycerides: 135 mg/dL (ref 0–149)
VLDL Cholesterol Cal: 27 mg/dL (ref 5–40)

## 2018-09-02 LAB — COMPREHENSIVE METABOLIC PANEL
ALT: 11 IU/L (ref 0–32)
AST: 15 IU/L (ref 0–40)
Albumin/Globulin Ratio: 2 (ref 1.2–2.2)
Albumin: 4.5 g/dL (ref 3.5–5.5)
Alkaline Phosphatase: 49 IU/L (ref 39–117)
BUN/Creatinine Ratio: 19 (ref 9–23)
BUN: 14 mg/dL (ref 6–24)
Bilirubin Total: 0.4 mg/dL (ref 0.0–1.2)
CO2: 22 mmol/L (ref 20–29)
Calcium: 9.6 mg/dL (ref 8.7–10.2)
Chloride: 100 mmol/L (ref 96–106)
Creatinine, Ser: 0.74 mg/dL (ref 0.57–1.00)
GFR calc Af Amer: 114 mL/min/{1.73_m2} (ref >59)
GFR calc non Af Amer: 99 mL/min/{1.73_m2} (ref >59)
Globulin, Total: 2.3 g/dL (ref 1.5–4.5)
Glucose: 77 mg/dL (ref 65–99)
Potassium: 3.5 mmol/L (ref 3.5–5.2)
Sodium: 139 mmol/L (ref 134–144)
Total Protein: 6.8 g/dL (ref 6.0–8.5)

## 2018-09-12 ENCOUNTER — Other Ambulatory Visit: Payer: Self-pay | Admitting: Nurse Practitioner

## 2018-09-15 ENCOUNTER — Encounter: Payer: Self-pay | Admitting: Nurse Practitioner

## 2018-10-04 ENCOUNTER — Other Ambulatory Visit: Payer: Self-pay | Admitting: Physician Assistant

## 2018-11-14 ENCOUNTER — Other Ambulatory Visit (HOSPITAL_COMMUNITY)
Admission: RE | Admit: 2018-11-14 | Discharge: 2018-11-14 | Disposition: A | Discharge status: Home / Self Care | Payer: 59 | Source: Ambulatory Visit | Attending: Obstetrics & Gynecology | Admitting: Obstetrics & Gynecology

## 2018-11-14 ENCOUNTER — Ambulatory Visit (INDEPENDENT_AMBULATORY_CARE_PROVIDER_SITE_OTHER): Payer: 59 | Admitting: Obstetrics & Gynecology

## 2018-11-14 ENCOUNTER — Encounter: Payer: Self-pay | Admitting: Obstetrics & Gynecology

## 2018-11-14 VITALS — BP 120/80 | Ht 62.5 in | Wt 155.0 lb

## 2018-11-14 DIAGNOSIS — Z1239 Encounter for other screening for malignant neoplasm of breast: Secondary | ICD-10-CM

## 2018-11-14 DIAGNOSIS — N870 Mild cervical dysplasia: Secondary | ICD-10-CM | POA: Diagnosis not present

## 2018-11-14 DIAGNOSIS — D251 Intramural leiomyoma of uterus: Secondary | ICD-10-CM

## 2018-11-14 DIAGNOSIS — Z01419 Encounter for gynecological examination (general) (routine) without abnormal findings: Secondary | ICD-10-CM | POA: Diagnosis not present

## 2018-11-14 NOTE — Patient Instructions (Signed)
PAP every year Mammogram every year    Call [phone] to schedule at [facility] yearly (with PCP)

## 2018-11-14 NOTE — Progress Notes (Signed)
HPI:      [name] is a 43 y.o. [id number] who LMP was Patient's last menstrual period was 11/05/2018., she presents today for her annual examination. The patient has no complaints today; her periods are reg and not as heavy, denies most menopausal sx's; denies pressure or bladder sx's. The patient is sexually active. Her last pap: approximate date 02/2018 and was normal and see below for cervical dysplasia history; and last mammogram: approximate date 2017 and was normal. The patient does perform self breast exams.  There is no notable family history of breast or ovarian cancer in her family.  The patient has regular exercise: yes.  The patient denies current symptoms of depression.    GYN History: Contraception: tubal ligation   2014 LGSIL, Colpo Bx CIN I 2016 and 2017 ASCUS 2018 LGSIL 2018 Colpo Bx CIN I Cryo 11/2017 2019 Normal PAP  PMHx: Past Medical History:  Diagnosis Date  . Cervical dysplasia    CIN1 on bx 2014  . Cervical high risk human papillomavirus (HPV) DNA test positive since 2013  . Hyperlipidemia   . Hypertension   . LGSIL on Pap smear of cervix 03/2017  . Obesity   . Vaginal high risk HPV DNA test positive 03/2017   Past Surgical History:  Procedure Laterality Date  . COLPOSCOPY    . TUBAL LIGATION     Family History  Problem Relation Age of Onset  . Kidney disease Mother        Stage 5  . Hypertension Mother   . Stroke Maternal Grandmother   . Heart disease Maternal Grandmother   . Hypertension Father   . Throat cancer Father        late 64s  . Heart attack Maternal Grandfather   . Cancer Neg Hx   . Diabetes Neg Hx    Social History   Tobacco Use  . Smoking status: Never Smoker  . Smokeless tobacco: Never Used  Substance Use Topics  . Alcohol use: No    Alcohol/week: 0.0 standard drinks  . Drug use: No    Current Outpatient Medications:  .  buPROPion (WELLBUTRIN XL) 300 MG 24 hr tablet, TAKE 1 TABLET BY MOUTH EVERY DAY, Disp: 90  tablet, Rfl: 1 .  chlorthalidone (HYGROTON) 25 MG tablet, TAKE 1 TABLET BY MOUTH ONCE DAILY, Disp: 30 tablet, Rfl: 11 .  gemfibrozil (LOPID) 600 MG tablet, TAKE 1 TABLET BY MOUTH TWICE A DAY BEFORE MEALS, Disp: 60 tablet, Rfl: 3 Allergies: Patient has no known allergies.  Review of Systems  Constitutional: Negative for chills, fever and malaise/fatigue.  HENT: Negative for congestion, sinus pain and sore throat.   Eyes: Negative for blurred vision and pain.  Respiratory: Negative for cough and wheezing.   Cardiovascular: Negative for chest pain and leg swelling.  Gastrointestinal: Negative for abdominal pain, constipation, diarrhea, heartburn, nausea and vomiting.  Genitourinary: Negative for dysuria, frequency, hematuria and urgency.  Musculoskeletal: Negative for back pain, joint pain, myalgias and neck pain.  Skin: Negative for itching and rash.  Neurological: Negative for dizziness, tremors and weakness.  Endo/Heme/Allergies: Does not bruise/bleed easily.  Psychiatric/Behavioral: Negative for depression. The patient is not nervous/anxious and does not have insomnia.     Objective: BP 120/80   Ht 5' 2.5" (1.588 m)   Wt 155 lb (70.3 kg)   LMP 11/05/2018   BMI 27.90 kg/m   Filed Weights   11/14/18 0806  Weight: 155 lb (70.3 kg)   Body mass index  is 27.9 kg/m. Physical Exam Constitutional:      General: She is not in acute distress.    Appearance: She is well-developed.  Genitourinary:     Pelvic exam was performed with patient supine.     Vagina and rectum normal.     No lesions in the vagina.     No vaginal bleeding.     No cervical motion tenderness, friability, lesion or polyp.     Uterus is mobile.     Uterus is not enlarged.     Uterine mass present.    Uterus is midaxial.     No right or left adnexal mass present.     Right adnexa not tender.     Left adnexa not tender.     Genitourinary Comments: Fibroid uterus that is multiglobular and anterior fibroid  location  HENT:     Head: Normocephalic and atraumatic. No laceration.     Right Ear: Hearing normal.     Left Ear: Hearing normal.     Mouth/Throat:     Pharynx: Uvula midline.  Eyes:     Pupils: Pupils are equal, round, and reactive to light.  Neck:     Musculoskeletal: Normal range of motion and neck supple.     Thyroid: No thyromegaly.  Cardiovascular:     Rate and Rhythm: Normal rate and regular rhythm.     Heart sounds: No murmur. No friction rub. No gallop.   Pulmonary:     Effort: Pulmonary effort is normal. No respiratory distress.     Breath sounds: Normal breath sounds. No wheezing.  Chest:     Breasts:        Right: No mass, skin change or tenderness.        Left: No mass, skin change or tenderness.  Abdominal:     General: Bowel sounds are normal. There is no distension.     Palpations: Abdomen is soft.     Tenderness: There is no abdominal tenderness. There is no rebound.  Musculoskeletal: Normal range of motion.  Neurological:     Mental Status: She is alert and oriented to person, place, and time.     Cranial Nerves: No cranial nerve deficit.  Skin:    General: Skin is warm and dry.  Psychiatric:        Judgment: Judgment normal.  Vitals signs reviewed.   Assessment:   1. Women's annual routine gynecological examination   2. CIN I (cervical intraepithelial neoplasia I)   3. Screening for breast cancer   4. Intramural leiomyoma of uterus    Screening Plan:            1.  Cervical Screening-  Pap smear done today  2. Breast screening- Exam annually and mammogram>40 planned   3. Colonoscopy every 10 years, Hemoccult testing - after age 57  4. Labs managed by PCP  5. Counseling for contraception: bilateral tubal ligation   6. Fibroid uterus, asymptomatic, monitor fo change in symptoms.  [name] if sx;s become bothersome.  Options for tx discussed. Fibroid treatment such as [place], Lupron, Myomectomy, and Hysterectomy discussed in detail, with the pros and  cons of each choice counseled.  No treatment as an option also discussed, as well as control of symptoms alone with hormone therapy. Information provided to the patient.    F/U  Return in about 1 year (around 11/15/2019) for Annual.  Barnett Applebaum, MD, [facility], [facility] 11/14/2018  8:23 AM

## 2018-11-17 LAB — CYTOLOGY - PAP: Diagnosis: NEGATIVE

## 2018-11-20 ENCOUNTER — Encounter: Payer: Self-pay | Admitting: Obstetrics & Gynecology

## 2018-11-24 ENCOUNTER — Other Ambulatory Visit: Payer: Self-pay | Admitting: Obstetrics & Gynecology

## 2018-12-12 ENCOUNTER — Other Ambulatory Visit: Payer: Self-pay | Admitting: Obstetrics & Gynecology

## 2019-02-08 ENCOUNTER — Other Ambulatory Visit: Payer: Self-pay | Admitting: Nurse Practitioner

## 2019-03-03 ENCOUNTER — Ambulatory Visit: Payer: 59 | Admitting: Nurse Practitioner

## 2019-03-09 ENCOUNTER — Other Ambulatory Visit: Payer: Self-pay

## 2019-03-09 ENCOUNTER — Encounter: Payer: Self-pay | Admitting: Nurse Practitioner

## 2019-03-09 ENCOUNTER — Ambulatory Visit (INDEPENDENT_AMBULATORY_CARE_PROVIDER_SITE_OTHER): Payer: 59 | Admitting: Nurse Practitioner

## 2019-03-09 VITALS — BP 133/86 | HR 59 | Temp 98.4°F | Ht 62.5 in | Wt 166.8 lb

## 2019-03-09 DIAGNOSIS — Z23 Encounter for immunization: Secondary | ICD-10-CM

## 2019-03-09 DIAGNOSIS — I10 Essential (primary) hypertension: Secondary | ICD-10-CM | POA: Diagnosis not present

## 2019-03-09 DIAGNOSIS — F325 Major depressive disorder, single episode, in full remission: Secondary | ICD-10-CM | POA: Diagnosis not present

## 2019-03-09 DIAGNOSIS — E6609 Other obesity due to excess calories: Secondary | ICD-10-CM

## 2019-03-09 DIAGNOSIS — E785 Hyperlipidemia, unspecified: Secondary | ICD-10-CM | POA: Diagnosis not present

## 2019-03-09 DIAGNOSIS — Z6830 Body mass index (BMI) 30.0-30.9, adult: Secondary | ICD-10-CM

## 2019-03-09 MED ORDER — CHLORTHALIDONE 25 MG PO TABS: 25.0000 mg | ORAL_TABLET | Freq: Every day | ORAL | 2 refills | Status: DC

## 2019-03-09 MED ORDER — BUPROPION HCL ER (XL) 300 MG PO TB24: 300.0000 mg | ORAL_TABLET | Freq: Every day | ORAL | 2 refills | Status: DC

## 2019-03-09 NOTE — Assessment & Plan Note (Addendum)
Chronic, ongoing.  Continue current medication regimen.  BP at goal today on recheck.

## 2019-03-09 NOTE — Progress Notes (Signed)
BP 133/86   Pulse (!) 59   Temp 98.4 F (36.9 C) (Oral)   Ht 5' 2.5" (1.588 m)   Wt 166 lb 12.8 oz (75.7 kg)   SpO2 98%   BMI 30.02 kg/m    Subjective:    Patient ID: Julia Campbell, female    DOB: 18-Jan-1976, 43 y.o.   MRN: [id number]  HPI: Julia Campbell is a 43 y.o. female  Chief Complaint  Patient presents with  . Follow-up    HTN/Depression  . Weight Check    Pt concerned over 11 lbs weight gain since Feb.    HYPERTENSION / HYPERLIPIDEMIA Currently on Hygroton and Lopid (only takes once a day, not twice a day).   Satisfied with current treatment? yes Duration of hypertension: chronic BP monitoring frequency: not checking BP range:  BP medication side effects: no Duration of hyperlipidemia: chronic Cholesterol medication side effects: no Cholesterol supplements: none Medication compliance: good compliance Aspirin: no Recent stressors: no Recurrent headaches: no Visual changes: no Palpitations: no Dyspnea: no Chest pain: no Lower extremity edema: no Dizzy/lightheaded: no   DEPRESSION Continues on Wellbutrin and reports good control. Mood status: stable Satisfied with current treatment?: no Symptom severity: mild  Duration of current treatment : chronic Side effects: no Medication compliance: good compliance Psychotherapy/counseling: none Previous psychiatric medications: Wellbutrin Anxious mood: no Anhedonia: no Significant weight loss or gain: no Insomnia: none Fatigue: no Feelings of worthlessness or guilt: no Impaired concentration/indecisiveness: no Suicidal ideations: no Hopelessness: no Crying spells: no Depression screen [facility] 2/9 03/09/2019 09/01/2018 04/14/2018 01/13/2018 10/14/2017  Decreased Interest 0 0 0 0 0  Down, Depressed, Hopeless 1 0 0 0 1  PHQ - 2 Score 1 0 0 0 1  Altered sleeping 0 0 0 0 0  Tired, decreased energy 0 0 0 0 0  Change in appetite 2 0 0 0 1  Feeling bad or failure about yourself  2 0 0 0 1  Trouble concentrating 0  0 0 0 0  Moving slowly or fidgety/restless 0 0 0 0 0  Suicidal thoughts 0 0 0 0 0  PHQ-9 Score 5 0 0 0 3  Difficult doing work/chores Not difficult at all Not difficult at all Not difficult at all - -    WEIGHT GAIN: Has been working from home with Covid 19 and endorses snacking frequently.  Works in [employer] and it is fast paced, stressful.  Current work hours 8:30 am to 5:30, although she often starts at 7:30 am and sometimes works later.  Also works on weekend at times.  Current snacks include chips and sweets.    Relevant past medical, surgical, family and social history reviewed and updated as indicated. Interim medical history since our last visit reviewed. Allergies and medications reviewed and updated.  Review of Systems  Constitutional: Negative for activity change, appetite change, diaphoresis, fatigue and fever.  Respiratory: Negative for cough, chest tightness and shortness of breath.   Cardiovascular: Negative for chest pain, palpitations and leg swelling.  Gastrointestinal: Negative for abdominal distention, abdominal pain, constipation, diarrhea, nausea and vomiting.  Endocrine: Negative for cold intolerance, heat intolerance, polydipsia, polyphagia and polyuria.  Neurological: Negative for dizziness, syncope, weakness, light-headedness, numbness and headaches.  Psychiatric/Behavioral: Negative.     Per HPI unless specifically indicated above     Objective:    BP 133/86   Pulse (!) 59   Temp 98.4 F (36.9 C) (Oral)   Ht 5' 2.5" (1.588 m)   Wt 166 lb  12.8 oz (75.7 kg)   SpO2 98%   BMI 30.02 kg/m   Wt Readings from Last 3 Encounters:  03/09/19 166 lb 12.8 oz (75.7 kg)  11/14/18 155 lb (70.3 kg)  09/01/18 146 lb (66.2 kg)    Physical Exam Vitals signs and nursing note reviewed.  Constitutional:      General: She is awake. She is not in acute distress.    Appearance: She is well-developed and overweight. She is not ill-appearing.  HENT:     Head:  Normocephalic.     Right Ear: Hearing normal.     Left Ear: Hearing normal.     Nose: Nose normal.     Mouth/Throat:     Mouth: Mucous membranes are moist.  Eyes:     General: Lids are normal.        Right eye: No discharge.        Left eye: No discharge.     Conjunctiva/sclera: Conjunctivae normal.     Pupils: Pupils are equal, round, and reactive to light.  Neck:     Musculoskeletal: Normal range of motion and neck supple.     Thyroid: No thyromegaly.     Vascular: No carotid bruit or JVD.  Cardiovascular:     Rate and Rhythm: Normal rate and regular rhythm.     Heart sounds: Normal heart sounds. No murmur. No gallop.   Pulmonary:     Effort: Pulmonary effort is normal. No accessory muscle usage or respiratory distress.     Breath sounds: Normal breath sounds.  Abdominal:     General: Bowel sounds are normal.     Palpations: Abdomen is soft. There is no hepatomegaly or splenomegaly.  Musculoskeletal:     Right lower leg: No edema.     Left lower leg: No edema.  Lymphadenopathy:     Cervical: No cervical adenopathy.  Skin:    General: Skin is warm and dry.  Neurological:     Mental Status: She is alert and oriented to person, place, and time.  Psychiatric:        Attention and Perception: Attention normal.        Mood and Affect: Mood normal.        Behavior: Behavior normal. Behavior is cooperative.        Thought Content: Thought content normal.        Judgment: Judgment normal.     Results for orders placed or performed in visit on 11/14/18  Cytology - PAP  Result Value Ref Range   Adequacy      Satisfactory for evaluation  endocervical/transformation zone component PRESENT.   Diagnosis      NEGATIVE FOR INTRAEPITHELIAL LESIONS OR MALIGNANCY.   Material Submitted CervicoVaginal Pap [ThinPrep Imaged]       Assessment & Plan:   Problem List Items Addressed This Visit      Cardiovascular and Mediastinum   Essential hypertension    Chronic, ongoing.   Continue current medication regimen.  BP at goal today on recheck.      Relevant Medications   chlorthalidone (HYGROTON) 25 MG tablet   Other Relevant Orders   Comprehensive metabolic panel   CBC with Differential/Platelet     Other   Hyperlipidemia    Chronic, ongoing.  Continue current medication regimen.  Lipid panel today and adjust medication as needed.      Relevant Medications   chlorthalidone (HYGROTON) 25 MG tablet   Other Relevant Orders   Lipid Panel w/o Chol/HDL  Ratio   Obesity    Recommend continued focus on health diet choices and regular physical activity (30 minutes 5 days a week).  Recommend avoiding high calorie snacks and trying protein shakes and small portions for meals.  Check TSH.      Relevant Orders   Thyroid Panel With TSH   Depression, major, single episode, complete remission ([place]) - Primary    Chronic, ongoing.  Continue current medication regimen, Wellbutrin.        Relevant Medications   buPROPion (WELLBUTRIN XL) 300 MG 24 hr tablet    Other Visit Diagnoses    Need for Tdap vaccination       Relevant Orders   Tdap vaccine greater than or equal to 7yo IM (Completed)       Follow up plan: Return in about 6 months (around 09/08/2019) for HTN/HLD, Mood.

## 2019-03-09 NOTE — Assessment & Plan Note (Signed)
Chronic, ongoing.  Continue current medication regimen.  Lipid panel today and adjust medication as needed.

## 2019-03-09 NOTE — Assessment & Plan Note (Addendum)
Recommend continued focus on health diet choices and regular physical activity (30 minutes 5 days a week).  Recommend avoiding high calorie snacks and trying protein shakes and small portions for meals.  Check TSH.

## 2019-03-09 NOTE — Patient Instructions (Signed)
Tdap Vaccine (Tetanus, Diphtheria and Pertussis): What You Need to Know  1. Why get vaccinated?  Tetanus, diphtheria and pertussis are very serious diseases. Tdap vaccine can protect us from these diseases. And, Tdap vaccine given to pregnant women can protect newborn babies against pertussis..  TETANUS (Lockjaw) is rare in the [place] today. It causes painful muscle tightening and stiffness, usually all over the body.  · It can lead to tightening of muscles in the head and neck so you can't open your mouth, swallow, or sometimes even breathe. Tetanus kills about 1 out of 10 people who are infected even after receiving the best medical care.  DIPHTHERIA is also rare in the [place] today. It can cause a thick coating to form in the back of the throat.  · It can lead to breathing problems, heart failure, paralysis, and death.  PERTUSSIS (Whooping Cough) causes severe coughing spells, which can cause difficulty breathing, vomiting and disturbed sleep.  · It can also lead to weight loss, incontinence, and rib fractures. Up to 2 in 100 adolescents and 5 in 100 adults with pertussis are hospitalized or have complications, which could include pneumonia or death.  These diseases are caused by bacteria. Diphtheria and pertussis are spread from person to person through secretions from coughing or sneezing. Tetanus enters the body through cuts, scratches, or wounds.  Before vaccines, as many as 200,000 cases of diphtheria, 200,000 cases of pertussis, and hundreds of cases of tetanus, were reported in the [place] each year. Since vaccination began, reports of cases for tetanus and diphtheria have dropped by about 99% and for pertussis by about 80%.  2. Tdap vaccine  Tdap vaccine can protect adolescents and adults from tetanus, diphtheria, and pertussis. One dose of Tdap is routinely given at age 11 or 12. People who did not get Tdap at that age should get it as soon as possible.  Tdap is especially important  for healthcare professionals and anyone having close contact with a baby younger than 12 months.  Pregnant women should get a dose of Tdap during every pregnancy, to protect the newborn from pertussis. Infants are most at risk for severe, life-threatening complications from pertussis.  Another vaccine, called Td, protects against tetanus and diphtheria, but not pertussis. A Td booster should be given every 10 years. Tdap may be given as one of these boosters if you have never gotten Tdap before. Tdap may also be given after a severe cut or burn to prevent tetanus infection.  Your doctor or the person giving you the vaccine can give you more information.  Tdap may safely be given at the same time as other vaccines.  3. Some people should not get this vaccine  · A person who has ever had a life-threatening allergic reaction after a previous dose of any diphtheria, tetanus or pertussis containing vaccine, OR has a severe allergy to any part of this vaccine, should not get Tdap vaccine. Tell the person giving the vaccine about any severe allergies.  · Anyone who had coma or long repeated seizures within 7 days after a childhood dose of DTP or DTaP, or a previous dose of Tdap, should not get Tdap, unless a cause other than the vaccine was found. They can still get Td.  · Talk to your doctor if you:  ? have seizures or another nervous system problem,  ? had severe pain or swelling after any vaccine containing diphtheria, tetanus or pertussis,  ? ever had a condition   called Guillain-Barré Syndrome (GBS),  ? aren't feeling well on the day the shot is scheduled.  4. Risks  With any medicine, including vaccines, there is a chance of side effects. These are usually mild and go away on their own. Serious reactions are also possible but are rare.  Most people who get Tdap vaccine do not have any problems with it.  Mild problems following Tdap  (Did not interfere with activities)  · Pain where the shot was given (about 3 in 4  adolescents or 2 in 3 adults)  · Redness or swelling where the shot was given (about 1 person in 5)  · Mild fever of at least 100.4°F (up to about 1 in 25 adolescents or 1 in 100 adults)  · Headache (about 3 or 4 people in 10)  · Tiredness (about 1 person in 3 or 4)  · Nausea, vomiting, diarrhea, stomach ache (up to 1 in 4 adolescents or 1 in 10 adults)  · Chills, sore joints (about 1 person in 10)  · Body aches (about 1 person in 3 or 4)  · Rash, swollen glands (uncommon)  Moderate problems following Tdap  (Interfered with activities, but did not require medical attention)  · Pain where the shot was given (up to 1 in 5 or 6)  · Redness or swelling where the shot was given (up to about 1 in 16 adolescents or 1 in 12 adults)  · Fever over 102°F (about 1 in 100 adolescents or 1 in 250 adults)  · Headache (about 1 in 7 adolescents or 1 in 10 adults)  · Nausea, vomiting, diarrhea, stomach ache (up to 1 or 3 people in 100)  · Swelling of the entire arm where the shot was given (up to about 1 in 500).  Severe problems following Tdap  (Unable to perform usual activities; required medical attention)  · Swelling, severe pain, bleeding and redness in the arm where the shot was given (rare).  Problems that could happen after any vaccine:  · People sometimes faint after a medical procedure, including vaccination. Sitting or lying down for about 15 minutes can help prevent fainting, and injuries caused by a fall. Tell your doctor if you feel dizzy, or have vision changes or ringing in the ears.  · Some people get severe pain in the shoulder and have difficulty moving the arm where a shot was given. This happens very rarely.  · Any medication can cause a severe allergic reaction. Such reactions from a vaccine are very rare, estimated at fewer than 1 in a million doses, and would happen within a few minutes to a few hours after the vaccination.  As with any medicine, there is a very remote chance of a vaccine causing a serious  injury or death.  The safety of vaccines is always being monitored. For more information, visit: www.cdc.gov/vaccinesafety/  5. What if there is a serious problem?  What should I look for?  · Look for anything that concerns you, such as signs of a severe allergic reaction, very high fever, or unusual behavior.  Signs of a severe allergic reaction can include hives, swelling of the face and throat, difficulty breathing, a fast heartbeat, dizziness, and weakness. These would usually start a few minutes to a few hours after the vaccination.  What should I do?  · If you think it is a severe allergic reaction or other emergency that can't wait, call 9-1-1 or get the person to the nearest hospital. Otherwise,   call your doctor.  · Afterward, the reaction should be reported to the Vaccine Adverse Event Reporting System (VAERS). Your doctor might file this report, or you can do it yourself through the VAERS web site at www.vaers.hhs.gov, or by calling 1-800-822-7967.  VAERS does not give medical advice.  6. The National Vaccine Injury Compensation Program  The National Vaccine Injury Compensation Program (VICP) is a federal program that was created to compensate people who may have been injured by certain vaccines.  Persons who believe they may have been injured by a vaccine can learn about the program and about filing a claim by calling 1-800-338-2382 or visiting the VICP website at www.hrsa.gov/vaccinecompensation. There is a time limit to file a claim for compensation.  7. How can I learn more?  · Ask your doctor. He or she can give you the vaccine package insert or suggest other sources of information.  · Call your local or state health department.  · Contact the Centers for Disease Control and Prevention (CDC):  ? Call 1-800-232-4636 (1-800-CDC-INFO) or  ? Visit CDC's website at www.cdc.gov/vaccines  Vaccine Information Statement Tdap Vaccine (11/10/2013)  This information is not intended to replace advice given to you  by your health care provider. Make sure you discuss any questions you have with your health care provider.  Document Released: 03/04/2012 Document Revised: 04/21/2018 Document Reviewed: 04/21/2018  Elsevier Interactive Patient Education © 2019 [employer] Inc.

## 2019-03-09 NOTE — Assessment & Plan Note (Signed)
Chronic, ongoing.  Continue current medication regimen, Wellbutrin.

## 2019-03-10 LAB — LIPID PANEL W/O CHOL/HDL RATIO
Cholesterol, Total: 267 mg/dL — ABNORMAL HIGH (ref 100–199)
HDL: 71 mg/dL (ref >39)
LDL Calculated: 162 mg/dL — ABNORMAL HIGH (ref 0–99)
Triglycerides: 168 mg/dL — ABNORMAL HIGH (ref 0–149)
VLDL Cholesterol Cal: 34 mg/dL (ref 5–40)

## 2019-03-10 LAB — CBC WITH DIFFERENTIAL/PLATELET
Basophils Absolute: 0.1 10*3/uL (ref 0.0–0.2)
Basos: 1 %
EOS (ABSOLUTE): 0.2 10*3/uL (ref 0.0–0.4)
Eos: 2 %
Hematocrit: 41.5 % (ref 34.0–46.6)
Hemoglobin: 13.5 g/dL (ref 11.1–15.9)
Immature Grans (Abs): 0.1 10*3/uL (ref 0.0–0.1)
Immature Granulocytes: 1 %
Lymphocytes Absolute: 3.4 10*3/uL — ABNORMAL HIGH (ref 0.7–3.1)
Lymphs: 33 %
MCH: 30.7 pg (ref 26.6–33.0)
MCHC: 32.5 g/dL (ref 31.5–35.7)
MCV: 94 fL (ref 79–97)
Monocytes Absolute: 0.5 10*3/uL (ref 0.1–0.9)
Monocytes: 5 %
Neutrophils Absolute: 6.2 10*3/uL (ref 1.4–7.0)
Neutrophils: 58 %
Platelets: 443 10*3/uL (ref 150–450)
RBC: 4.4 x10E6/uL (ref 3.77–5.28)
RDW: 13.5 % (ref 11.7–15.4)
WBC: 10.4 10*3/uL (ref 3.4–10.8)

## 2019-03-10 LAB — COMPREHENSIVE METABOLIC PANEL
ALT: 13 IU/L (ref 0–32)
AST: 14 IU/L (ref 0–40)
Albumin/Globulin Ratio: 1.9 (ref 1.2–2.2)
Albumin: 4.6 g/dL (ref 3.8–4.8)
Alkaline Phosphatase: 52 IU/L (ref 39–117)
BUN/Creatinine Ratio: 22 (ref 9–23)
BUN: 15 mg/dL (ref 6–24)
Bilirubin Total: 0.3 mg/dL (ref 0.0–1.2)
CO2: 24 mmol/L (ref 20–29)
Calcium: 9.4 mg/dL (ref 8.7–10.2)
Chloride: 100 mmol/L (ref 96–106)
Creatinine, Ser: 0.67 mg/dL (ref 0.57–1.00)
GFR calc Af Amer: 123 mL/min/{1.73_m2} (ref >59)
GFR calc non Af Amer: 106 mL/min/{1.73_m2} (ref >59)
Globulin, Total: 2.4 g/dL (ref 1.5–4.5)
Glucose: 78 mg/dL (ref 65–99)
Potassium: 3.6 mmol/L (ref 3.5–5.2)
Sodium: 142 mmol/L (ref 134–144)
Total Protein: 7 g/dL (ref 6.0–8.5)

## 2019-03-10 LAB — THYROID PANEL WITH TSH
Free Thyroxine Index: 1.8 (ref 1.2–4.9)
T3 Uptake Ratio: 24 % (ref 24–39)
T4, Total: 7.6 ug/dL (ref 4.5–12.0)
TSH: 2.74 u[IU]/mL (ref 0.450–4.500)

## 2019-05-26 ENCOUNTER — Telehealth: Payer: Self-pay | Admitting: Nurse Practitioner

## 2019-05-26 NOTE — Telephone Encounter (Signed)
She would need to bring in urine to determine cause of UTI and if UTI present, as different bacterias require different antibiotics.  Could do virtual visit, but she would still need to provide urine sample.  Will need urine sample before sending antibiotic.

## 2019-05-26 NOTE — Telephone Encounter (Signed)
LVM for pt to call back to schedule.

## 2019-05-26 NOTE — Telephone Encounter (Signed)
Pt called and stated that she is certain that she has a UTI and would like meds sent to [facility]. Tried to schedule appt but pt declined.

## 2019-05-28 NOTE — Telephone Encounter (Signed)
Spoke with pt and she stated that she went to [facility] to be seen.

## 2019-09-08 ENCOUNTER — Ambulatory Visit: Payer: 59 | Admitting: Nurse Practitioner

## 2019-10-27 ENCOUNTER — Ambulatory Visit: Payer: 59 | Admitting: Nurse Practitioner

## 2019-10-28 ENCOUNTER — Encounter: Payer: Self-pay | Admitting: Nurse Practitioner

## 2019-10-28 ENCOUNTER — Other Ambulatory Visit: Payer: Self-pay

## 2019-10-28 ENCOUNTER — Ambulatory Visit (INDEPENDENT_AMBULATORY_CARE_PROVIDER_SITE_OTHER): Payer: 59 | Admitting: Nurse Practitioner

## 2019-10-28 VITALS — BP 123/82 | HR 92 | Temp 98.8°F

## 2019-10-28 DIAGNOSIS — I10 Essential (primary) hypertension: Secondary | ICD-10-CM

## 2019-10-28 DIAGNOSIS — F325 Major depressive disorder, single episode, in full remission: Secondary | ICD-10-CM

## 2019-10-28 DIAGNOSIS — Z6831 Body mass index (BMI) 31.0-31.9, adult: Secondary | ICD-10-CM

## 2019-10-28 DIAGNOSIS — E6609 Other obesity due to excess calories: Secondary | ICD-10-CM | POA: Diagnosis not present

## 2019-10-28 DIAGNOSIS — E782 Mixed hyperlipidemia: Secondary | ICD-10-CM

## 2019-10-28 MED ORDER — GEMFIBROZIL 600 MG PO TABS: ORAL_TABLET | ORAL | 3 refills | Status: DC

## 2019-10-28 MED ORDER — BUPROPION HCL ER (XL) 300 MG PO TB24: 300.0000 mg | ORAL_TABLET | Freq: Every day | ORAL | 2 refills | Status: DC

## 2019-10-28 MED ORDER — CHLORTHALIDONE 25 MG PO TABS: 25.0000 mg | ORAL_TABLET | Freq: Every day | ORAL | 3 refills | Status: DC

## 2019-10-28 NOTE — Patient Instructions (Signed)

## 2019-10-28 NOTE — Assessment & Plan Note (Addendum)
Chronic, ongoing.  Continue current medication regimen.  Lipid panel today and adjust medication as needed.  Refills sent in.

## 2019-10-28 NOTE — Assessment & Plan Note (Signed)
Chronic, stable.  Continue current medication regimen and adjust as needed.  BP at goal today in office.  Check BMP.  Recommend occasional BP checks at home and documenting these for provider.  Refills sent.

## 2019-10-28 NOTE — Assessment & Plan Note (Signed)
Referral to weight management provider placed, patient interested in weight loss medications.  Recommend continued focus on healthy diet choices and regular physical activity (30 minutes 5 days a week).

## 2019-10-28 NOTE — Progress Notes (Signed)
BP 123/82   Pulse 92   Temp 98.8 F (37.1 C) (Oral)   SpO2 98%    Subjective:    Patient ID: Julia Campbell, female    DOB: 10-31-1975, 44 y.o.   MRN: [id number]  HPI: Julia Campbell is a 44 y.o. female  Chief Complaint  Patient presents with  . Depression  . Hyperlipidemia  . Hypertension   HYPERTENSION / HYPERLIPIDEMIA Currently on [place] and Lopid (only takes once a day, not twice a day).  She is frustrated with her weight, feels she has gained.  Is interested in a referral to weight management, as is interested in weight loss medications. Satisfied with current treatment? yes Duration of hypertension: chronic BP monitoring frequency: not checking BP range:  BP medication side effects: no Duration of hyperlipidemia: chronic Cholesterol medication side effects: no Cholesterol supplements: none Medication compliance: good compliance Aspirin: no Recent stressors: no Recurrent headaches: no Visual changes: no Palpitations: no Dyspnea: no Chest pain: no Lower extremity edema: no Dizzy/lightheaded: no  The 10-year ASCVD risk score Mikey Bussing DC Jr., et al., 2013) is: 1.2%   Values used to calculate the score:     Age: 57 years     Sex: Female     Is Non-Hispanic African American: No     Diabetic: No     Tobacco smoker: No     Systolic Blood Pressure: [id number] mmHg     Is BP treated: Yes     HDL Cholesterol: 71 mg/dL     Total Cholesterol: 267 mg/dL  DEPRESSION Continues on Wellbutrin and reports good control. Mood status: stable Satisfied with current treatment?: no Symptom severity: mild  Duration of current treatment : chronic Side effects: no Medication compliance: good compliance Psychotherapy/counseling: none Previous psychiatric medications: Wellbutrin Anxious mood: no Anhedonia: no Significant weight loss or gain: no Insomnia: none Fatigue: no Feelings of worthlessness or guilt: no Impaired concentration/indecisiveness: no Suicidal ideations:  no Hopelessness: no Crying spells: no Depression screen [facility] 2/9 10/28/2019 10/28/2019 03/09/2019 09/01/2018 04/14/2018  Decreased Interest 0 0 0 0 0  Down, Depressed, Hopeless 1 1 1  0 0  PHQ - 2 Score 1 1 1  0 0  Altered sleeping 0 0 0 0 0  Tired, decreased energy 0 0 0 0 0  Change in appetite 1 1 2  0 0  Feeling bad or failure about yourself  1 1 2  0 0  Trouble concentrating 0 0 0 0 0  Moving slowly or fidgety/restless 0 0 0 0 0  Suicidal thoughts 0 0 0 0 0  PHQ-9 Score 3 3 5  0 0  Difficult doing work/chores - - Not difficult at all Not difficult at all Not difficult at all  Some recent data might be hidden    Relevant past medical, surgical, family and social history reviewed and updated as indicated. Interim medical history since our last visit reviewed. Allergies and medications reviewed and updated.  Review of Systems  Constitutional: Negative for activity change, appetite change, diaphoresis, fatigue and fever.  Respiratory: Negative for cough, chest tightness and shortness of breath.   Cardiovascular: Negative for chest pain, palpitations and leg swelling.  Gastrointestinal: Negative.   Endocrine: Negative.   Neurological: Negative.   Psychiatric/Behavioral: Negative.     Per HPI unless specifically indicated above     Objective:    BP 123/82   Pulse 92   Temp 98.8 F (37.1 C) (Oral)   SpO2 98%   Wt Readings from Last 3 Encounters:  03/09/19 166 lb 12.8 oz (75.7 kg)  11/14/18 155 lb (70.3 kg)  09/01/18 146 lb (66.2 kg)    Physical Exam Vitals and nursing note reviewed.  Constitutional:      General: She is awake. She is not in acute distress.    Appearance: She is well-developed and overweight. She is not ill-appearing.  HENT:     Head: Normocephalic.     Right Ear: Hearing normal.     Left Ear: Hearing normal.  Eyes:     General: Lids are normal.        Right eye: No discharge.        Left eye: No discharge.     Conjunctiva/sclera: Conjunctivae normal.      Pupils: Pupils are equal, round, and reactive to light.  Neck:     Thyroid: No thyromegaly.     Vascular: No carotid bruit.  Cardiovascular:     Rate and Rhythm: Normal rate and regular rhythm.     Heart sounds: Normal heart sounds. No murmur. No gallop.   Pulmonary:     Effort: Pulmonary effort is normal. No accessory muscle usage or respiratory distress.     Breath sounds: Normal breath sounds.  Abdominal:     General: Bowel sounds are normal.     Palpations: Abdomen is soft.  Musculoskeletal:     Cervical back: Normal range of motion and neck supple.     Right lower leg: No edema.     Left lower leg: No edema.  Skin:    General: Skin is warm and dry.  Neurological:     Mental Status: She is alert and oriented to person, place, and time.  Psychiatric:        Attention and Perception: Attention normal.        Mood and Affect: Mood normal.        Behavior: Behavior normal. Behavior is cooperative.        Thought Content: Thought content normal.        Judgment: Judgment normal.     Results for orders placed or performed in visit on 03/09/19  Comprehensive metabolic panel  Result Value Ref Range   Glucose 78 65 - 99 mg/dL   BUN 15 6 - 24 mg/dL   Creatinine, Ser 0.67 0.57 - 1.00 mg/dL   GFR calc non Af Amer 106 >59 mL/min/1.73   GFR calc Af Amer 123 >59 mL/min/1.73   BUN/Creatinine Ratio 22 9 - 23   Sodium 142 134 - 144 mmol/L   Potassium 3.6 3.5 - 5.2 mmol/L   Chloride 100 96 - 106 mmol/L   CO2 24 20 - 29 mmol/L   Calcium 9.4 8.7 - 10.2 mg/dL   Total Protein 7.0 6.0 - 8.5 g/dL   Albumin 4.6 3.8 - 4.8 g/dL   Globulin, Total 2.4 1.5 - 4.5 g/dL   Albumin/Globulin Ratio 1.9 1.2 - 2.2   Bilirubin Total 0.3 0.0 - 1.2 mg/dL   Alkaline Phosphatase 52 39 - 117 IU/L   AST 14 0 - 40 IU/L   ALT 13 0 - 32 IU/L  CBC with Differential/Platelet  Result Value Ref Range   WBC 10.4 3.4 - 10.8 x10E3/uL   RBC 4.40 3.77 - 5.28 x10E6/uL   Hemoglobin 13.5 11.1 - 15.9 g/dL    Hematocrit 41.5 34.0 - 46.6 %   MCV 94 79 - 97 fL   MCH 30.7 26.6 - 33.0 pg   MCHC 32.5 31.5 - 35.7 g/dL  RDW 13.5 11.7 - 15.4 %   Platelets 443 150 - 450 x10E3/uL   Neutrophils 58 Not Estab. %   Lymphs 33 Not Estab. %   Monocytes 5 Not Estab. %   Eos 2 Not Estab. %   Basos 1 Not Estab. %   Neutrophils Absolute 6.2 1.4 - 7.0 x10E3/uL   Lymphocytes Absolute 3.4 (H) 0.7 - 3.1 x10E3/uL   Monocytes Absolute 0.5 0.1 - 0.9 x10E3/uL   EOS (ABSOLUTE) 0.2 0.0 - 0.4 x10E3/uL   Basophils Absolute 0.1 0.0 - 0.2 x10E3/uL   Immature Granulocytes 1 Not Estab. %   Immature Grans (Abs) 0.1 0.0 - 0.1 x10E3/uL  Lipid Panel w/o Chol/HDL Ratio  Result Value Ref Range   Cholesterol, Total 267 (H) 100 - 199 mg/dL   Triglycerides 168 (H) 0 - 149 mg/dL   HDL 71 >39 mg/dL   VLDL Cholesterol Cal 34 5 - 40 mg/dL   LDL Calculated 162 (H) 0 - 99 mg/dL  Thyroid Panel With TSH  Result Value Ref Range   TSH 2.740 0.450 - 4.500 uIU/mL   T4, Total 7.6 4.5 - 12.0 ug/dL   T3 Uptake Ratio 24 24 - 39 %   Free Thyroxine Index 1.8 1.2 - 4.9      Assessment & Plan:   Problem List Items Addressed This Visit      Cardiovascular and Mediastinum   Essential hypertension    Chronic, stable.  Continue current medication regimen and adjust as needed.  BP at goal today in office.  Check BMP.  Recommend occasional BP checks at home and documenting these for provider.  Refills sent.      Relevant Medications   gemfibrozil (LOPID) 600 MG tablet   chlorthalidone (HYGROTON) 25 MG tablet   Other Relevant Orders   Basic Metabolic Panel (BMET)     Other   Hyperlipidemia    Chronic, ongoing.  Continue current medication regimen.  Lipid panel today and adjust medication as needed.  Refills sent in.      Relevant Medications   gemfibrozil (LOPID) 600 MG tablet   chlorthalidone (HYGROTON) 25 MG tablet   Other Relevant Orders   Lipid Panel w/o Chol/HDL Ratio   Obesity    Referral to weight management provider placed,  patient interested in weight loss medications.  Recommend continued focus on healthy diet choices and regular physical activity (30 minutes 5 days a week).       Relevant Orders   Amb Ref to Medical Weight Management   Depression, major, single episode, complete remission ([place]) - Primary    Chronic, stable.  Denies SI/HI.  Continue Wellbutrin and adjust regimen as needed.  PHQ9 - 3, down from 5 on previous.  Refills sent.      Relevant Medications   buPROPion (WELLBUTRIN XL) 300 MG 24 hr tablet       Follow up plan: Return in about 6 months (around 04/26/2020) for HTN/HLD, Mood, Weight.

## 2019-10-28 NOTE — Assessment & Plan Note (Addendum)
Chronic, stable.  Denies SI/HI.  Continue Wellbutrin and adjust regimen as needed.  PHQ9 - 3, down from 5 on previous.  Refills sent.

## 2019-10-29 LAB — LIPID PANEL W/O CHOL/HDL RATIO
Cholesterol, Total: 234 mg/dL — ABNORMAL HIGH (ref 100–199)
HDL: 47 mg/dL (ref >39)
LDL Chol Calc (NIH): 143 mg/dL — ABNORMAL HIGH (ref 0–99)
Triglycerides: 246 mg/dL — ABNORMAL HIGH (ref 0–149)
VLDL Cholesterol Cal: 44 mg/dL — ABNORMAL HIGH (ref 5–40)

## 2019-10-29 LAB — BASIC METABOLIC PANEL
BUN/Creatinine Ratio: 28 — ABNORMAL HIGH (ref 9–23)
BUN: 18 mg/dL (ref 6–24)
CO2: 27 mmol/L (ref 20–29)
Calcium: 9.4 mg/dL (ref 8.7–10.2)
Chloride: 97 mmol/L (ref 96–106)
Creatinine, Ser: 0.65 mg/dL (ref 0.57–1.00)
GFR calc Af Amer: 123 mL/min/{1.73_m2} (ref >59)
GFR calc non Af Amer: 107 mL/min/{1.73_m2} (ref >59)
Glucose: 80 mg/dL (ref 65–99)
Potassium: 3.4 mmol/L — ABNORMAL LOW (ref 3.5–5.2)
Sodium: 139 mmol/L (ref 134–144)

## 2019-10-29 NOTE — Progress Notes (Signed)
Please let Ms. Loch know via telephone or letter: Labs have returned.   - Kidney function is stable.  Potassium is a little low.  Since she takes diuretic, I do want her to focus on potassium addition to her diet, via multivitamin or eating foods high in potassium such as bananas, sweet potatoes, prunes, raisins, cooked broccoli or spinach.  We will recheck this next visit and if remains low add a potassium supplement. - Cholesterol levels remain elevated, continue Lopid daily and diet focus. Have a great day!!

## 2019-10-30 ENCOUNTER — Telehealth: Payer: Self-pay | Admitting: *Deleted

## 2019-10-30 NOTE — Telephone Encounter (Signed)
Noted, thank you

## 2019-10-30 NOTE — Telephone Encounter (Signed)
Lab results given

## 2019-10-30 NOTE — Telephone Encounter (Signed)
Fyi.

## 2019-11-16 ENCOUNTER — Encounter (INDEPENDENT_AMBULATORY_CARE_PROVIDER_SITE_OTHER): Payer: Self-pay | Admitting: Family Medicine

## 2019-11-16 ENCOUNTER — Other Ambulatory Visit: Payer: Self-pay

## 2019-11-16 ENCOUNTER — Ambulatory Visit (INDEPENDENT_AMBULATORY_CARE_PROVIDER_SITE_OTHER): Payer: 59 | Admitting: Family Medicine

## 2019-11-16 VITALS — BP 138/85 | HR 105 | Temp 98.4°F | Ht 62.0 in | Wt 174.0 lb

## 2019-11-16 DIAGNOSIS — I10 Essential (primary) hypertension: Secondary | ICD-10-CM | POA: Diagnosis not present

## 2019-11-16 DIAGNOSIS — Z9189 Other specified personal risk factors, not elsewhere classified: Secondary | ICD-10-CM

## 2019-11-16 DIAGNOSIS — E669 Obesity, unspecified: Secondary | ICD-10-CM

## 2019-11-16 DIAGNOSIS — R5383 Other fatigue: Secondary | ICD-10-CM

## 2019-11-16 DIAGNOSIS — R0602 Shortness of breath: Secondary | ICD-10-CM

## 2019-11-16 DIAGNOSIS — E7849 Other hyperlipidemia: Secondary | ICD-10-CM

## 2019-11-16 DIAGNOSIS — F3289 Other specified depressive episodes: Secondary | ICD-10-CM

## 2019-11-16 DIAGNOSIS — Z6831 Body mass index (BMI) 31.0-31.9, adult: Secondary | ICD-10-CM

## 2019-11-16 DIAGNOSIS — Z0289 Encounter for other administrative examinations: Secondary | ICD-10-CM

## 2019-11-16 NOTE — Progress Notes (Signed)
Dear Julia Campbell,   Thank you for referring Julia Campbell to our clinic. The following note includes my evaluation and treatment recommendations.  Chief Complaint:   OBESITY Julia Campbell (MR# [id number]) is a 44 y.o. female who presents for evaluation and treatment of obesity and related comorbidities. Current BMI is Body mass index is 31.83 kg/m. Julia Campbell has been struggling with her weight for many years and has been unsuccessful in either losing weight, maintaining weight loss, or reaching her healthy weight goal.  Julia Campbell is currently in the action stage of change and ready to dedicate time achieving and maintaining a healthier weight. Julia Campbell is interested in becoming our patient and working on intensive lifestyle modifications including (but not limited to) diet and exercise for weight loss.  Julia Campbell skips breakfast.  Mid am snack - leftover [place] food - rice, beans, fajitas (satisfied, sometimes feels miserable full).  4-5 hours later she will have a [employer] microwave meal and something sweet like a brownie (feel full).  Dinner - Chicken 1 cup, rice 1 cup, corn or broccoli (1/2 cup) (feel full) after dinner snack.  Julia Campbell's habits were reviewed today and are as follows: Her family eats meals together, she thinks her family will eat healthier with her, her desired weight loss is 48 pounds, she has been heavy most of her life, she started gaining weight in her 6s, her heaviest weight ever was 186 pounds, she craves desserts,  she snacks frequently in the evenings, she skips breakfast frequently, she frequently makes poor food choices and she struggles with emotional eating.  Depression Screen Bernard's Food and Mood (modified PHQ-9) score was 13.  Depression screen PHQ 2/9 11/16/2019  Decreased Interest 2  Down, Depressed, Hopeless 3  PHQ - 2 Score 5  Altered sleeping 0  Tired, decreased energy 2  Change in appetite 2  Feeling bad or failure about yourself  3  Trouble concentrating  0  Moving slowly or fidgety/restless 0  Suicidal thoughts 1  PHQ-9 Score 13  Difficult doing work/chores Not difficult at all  Some recent data might be hidden   Subjective:   1. Other fatigue Julia Campbell denies daytime somnolence and denies waking up still tired. Julia Campbell generally gets 8 hours of sleep per night, and states that she has generally restful sleep. Snoring is not present. Apneic episodes are not present. Epworth Sleepiness Score is 2.  2. SOB (shortness of breath) on exertion Julia Campbell notes increasing shortness of breath with exercising and seems to be worsening over time with weight gain. She notes getting out of breath sooner with activity than she used to. This has gotten worse recently. Julia Campbell denies shortness of breath at rest or orthopnea.  3. Essential hypertension Review: taking medications as instructed, no medication side effects noted, no chest pain on exertion, no dyspnea on exertion, no swelling of ankles.  Julia Campbell was diagnosed several years ago.  On chlorthalidone. No history of trying other medications.   BP Readings from Last 3 Encounters:  11/16/19 138/85  10/28/19 123/82  03/09/19 133/86   4. Other hyperlipidemia Julia Campbell has hyperlipidemia and has been trying to improve her cholesterol levels with intensive lifestyle modification including a low saturated fat diet, exercise and weight loss. She denies any chest pain, claudication or myalgias.  She is taking gemfibrozil.  She is not sure if she has tried another medication previously.  Lab Results  Component Value Date   ALT 13 03/09/2019   AST 14 03/09/2019   ALKPHOS  52 03/09/2019   BILITOT 0.3 03/09/2019   Lab Results  Component Value Date   CHOL 234 (H) 10/28/2019   HDL 47 10/28/2019   LDLCALC 143 (H) 10/28/2019   TRIG 246 (H) 10/28/2019   5. Other depression, with emotional eating  She has been taking Wellbutrin 300 mg for 1 year.  No SI/HI.  6. At risk for heart disease Julia Campbell is at a higher than  average risk for cardiovascular disease due to obesity. Reviewed: no chest pain on exertion, no dyspnea on exertion, and no swelling of ankles.  Assessment/Plan:   1. Other fatigue Julia Campbell does feel that her weight is causing her energy to be lower than it should be. Fatigue may be related to obesity, depression or many other causes. Labs will be ordered, and in the meanwhile, Julia Campbell will focus on self care including making healthy food choices, increasing physical activity and focusing on stress reduction. - EKG 12-Lead - Hemoglobin A1c - Insulin, random - VITAMIN D 25 Hydroxy (Vit-D Deficiency, Fractures) - Vitamin B12 - Folate - T3 - T4, free - TSH  2. SOB (shortness of breath) on exertion Julia Campbell does feel that she gets out of breath more easily that she used to when she exercises. Julia Campbell's shortness of breath appears to be obesity related and exercise induced. She has agreed to work on weight loss and gradually increase exercise to treat her exercise induced shortness of breath. Will continue to monitor closely. - Hemoglobin A1c - Insulin, random - VITAMIN D 25 Hydroxy (Vit-D Deficiency, Fractures) - Vitamin B12 - Folate - T3 - T4, free - TSH  3. Essential hypertension Julia Campbell is working on healthy weight loss and exercise to improve blood pressure control. We will watch for signs of hypotension as she continues her lifestyle modifications. - Comprehensive metabolic panel - CBC with Differential/Platelet  4. Other hyperlipidemia Cardiovascular risk and specific lipid/LDL goals reviewed.  We discussed several lifestyle modifications today and Julia Campbell will continue to work on diet, exercise and weight loss efforts. Orders and follow up as documented in patient record.   Counseling Intensive lifestyle modifications are the first line treatment for this issue. . Dietary changes: Increase soluble fiber. Decrease simple carbohydrates. . Exercise changes: Moderate to vigorous-intensity  aerobic activity 150 minutes per week if tolerated. . Lipid-lowering medications: see documented in medical record. - CBC with Differential/Platelet - Hemoglobin A1c - Insulin, random  5. Other depression, with emotional eating  Follow up with PCP.  Patient was referred to Dr. Mallie Mussel, our Bariatric Psychologist, for evaluation due to her elevated PHQ-9 score and significant struggles with emotional eating.  6. At risk for heart disease Suttyn was given approximately 15 minutes of coronary artery disease prevention counseling today. She is 44 y.o. female and has risk factors for heart disease including obesity. We discussed intensive lifestyle modifications today with an emphasis on specific weight loss instructions and strategies.   Repetitive spaced learning was employed today to elicit superior memory formation and behavioral change.  7. Class 1 obesity with serious comorbidity and body mass index (BMI) of 31.0 to 31.9 in adult, unspecified obesity type Elesha is currently in the action stage of change and her goal is to continue with weight loss efforts. I recommend Iliany begin the structured treatment plan as follows:  She has agreed to the Category 3 Plan.  Exercise goals: No exercise has been prescribed at this time.   Behavioral modification strategies: increasing lean protein intake, increasing vegetables, meal planning and cooking  strategies, keeping healthy foods in the home and planning for success.  She was informed of the importance of frequent follow-up visits to maximize her success with intensive lifestyle modifications for her multiple health conditions. She was informed we would discuss her lab results at her next visit unless there is a critical issue that needs to be addressed sooner. Paulett agreed to keep her next visit at the agreed upon time to discuss these results.  Objective:   Blood pressure 138/85, pulse (!) 105, temperature 98.4 F (36.9 C), temperature source  Oral, height 5\' 2"  (1.575 m), weight 174 lb (78.9 kg), last menstrual period 10/30/2019, SpO2 96 %. Body mass index is 31.83 kg/m.  EKG: Sinus tachycardia.  Indirect Calorimeter completed today shows a VO2 of 254 and a REE of 1761.  Her calculated basal metabolic rate is [id number] thus her basal metabolic rate is better than expected.  General: Cooperative, alert, well developed, in no acute distress. HEENT: Conjunctivae and lids unremarkable. Cardiovascular: Regular rhythm.  Lungs: Normal work of breathing. Neurologic: No focal deficits.   Lab Results  Component Value Date   CREATININE 0.65 10/28/2019   BUN 18 10/28/2019   NA 139 10/28/2019   K 3.4 (L) 10/28/2019   CL 97 10/28/2019   CO2 27 10/28/2019   Lab Results  Component Value Date   ALT 13 03/09/2019   AST 14 03/09/2019   ALKPHOS 52 03/09/2019   BILITOT 0.3 03/09/2019   Lab Results  Component Value Date   TSH 2.740 03/09/2019   Lab Results  Component Value Date   CHOL 234 (H) 10/28/2019   HDL 47 10/28/2019   LDLCALC 143 (H) 10/28/2019   TRIG 246 (H) 10/28/2019   Lab Results  Component Value Date   WBC 10.4 03/09/2019   HGB 13.5 03/09/2019   HCT 41.5 03/09/2019   MCV 94 03/09/2019   PLT 443 03/09/2019   Attestation Statements:   This is the patient's first visit at [facility]. The patient's NEW PATIENT PACKET was reviewed at length. Included in the packet: current and past health history, medications, allergies, ROS, gynecologic history (women only), surgical history, family history, social history, weight history, weight loss surgery history (for those that have had weight loss surgery), nutritional evaluation, mood and food questionnaire, PHQ9, Epworth questionnaire, sleep habits questionnaire, patient life and health improvement goals questionnaire. These will all be scanned into the patient's chart under media.   During the visit, I independently reviewed the patient's EKG, bioimpedance scale  results, and indirect calorimeter results. I used this information to tailor a meal plan for the patient that will help her to lose weight and will improve her obesity-related conditions going forward. I performed a medically necessary appropriate examination and/or evaluation. I discussed the assessment and treatment plan with the patient. The patient was provided an opportunity to ask questions and all were answered. The patient agreed with the plan and demonstrated an understanding of the instructions. Labs were ordered at this visit and will be reviewed at the next visit unless more critical results need to be addressed immediately. Clinical information was updated and documented in the EMR.   Time spent on visit including pre-visit chart review and post-visit care was 45 minutes.   A separate 15 minutes was spent on risk counseling (see above).   I, Water quality scientist, CMA, am acting as transcriptionist for Coralie Common, MD.  I have reviewed the above documentation for accuracy and completeness, and I agree with the  above. - Ilene Qua, MD

## 2019-11-17 LAB — COMPREHENSIVE METABOLIC PANEL
ALT: 11 IU/L (ref 0–32)
AST: 12 IU/L (ref 0–40)
Albumin/Globulin Ratio: 1.4 (ref 1.2–2.2)
Albumin: 4.3 g/dL (ref 3.8–4.8)
Alkaline Phosphatase: 63 IU/L (ref 39–117)
BUN/Creatinine Ratio: 11 (ref 9–23)
BUN: 10 mg/dL (ref 6–24)
Bilirubin Total: 0.3 mg/dL (ref 0.0–1.2)
CO2: 23 mmol/L (ref 20–29)
Calcium: 9.5 mg/dL (ref 8.7–10.2)
Chloride: 99 mmol/L (ref 96–106)
Creatinine, Ser: 0.9 mg/dL (ref 0.57–1.00)
GFR calc Af Amer: 89 mL/min/{1.73_m2} (ref >59)
GFR calc non Af Amer: 77 mL/min/{1.73_m2} (ref >59)
Globulin, Total: 3 g/dL (ref 1.5–4.5)
Glucose: 81 mg/dL (ref 65–99)
Potassium: 3.6 mmol/L (ref 3.5–5.2)
Sodium: 138 mmol/L (ref 134–144)
Total Protein: 7.3 g/dL (ref 6.0–8.5)

## 2019-11-17 LAB — FOLATE: Folate: 10.3 ng/mL (ref >3.0)

## 2019-11-17 LAB — T4, FREE: Free T4: 1.28 ng/dL (ref 0.82–1.77)

## 2019-11-17 LAB — CBC WITH DIFFERENTIAL/PLATELET
Basophils Absolute: 0.1 10*3/uL (ref 0.0–0.2)
Basos: 1 %
EOS (ABSOLUTE): 0.2 10*3/uL (ref 0.0–0.4)
Eos: 2 %
Hematocrit: 41 % (ref 34.0–46.6)
Hemoglobin: 13.7 g/dL (ref 11.1–15.9)
Immature Grans (Abs): 0 10*3/uL (ref 0.0–0.1)
Immature Granulocytes: 0 %
Lymphocytes Absolute: 3.4 10*3/uL — ABNORMAL HIGH (ref 0.7–3.1)
Lymphs: 28 %
MCH: 30.6 pg (ref 26.6–33.0)
MCHC: 33.4 g/dL (ref 31.5–35.7)
MCV: 92 fL (ref 79–97)
Monocytes Absolute: 0.6 10*3/uL (ref 0.1–0.9)
Monocytes: 5 %
Neutrophils Absolute: 7.7 10*3/uL — ABNORMAL HIGH (ref 1.4–7.0)
Neutrophils: 64 %
Platelets: 466 10*3/uL — ABNORMAL HIGH (ref 150–450)
RBC: 4.48 x10E6/uL (ref 3.77–5.28)
RDW: 13.3 % (ref 11.7–15.4)
WBC: 12 10*3/uL — ABNORMAL HIGH (ref 3.4–10.8)

## 2019-11-17 LAB — HEMOGLOBIN A1C
Est. average glucose Bld gHb Est-mCnc: 111 mg/dL
Hgb A1c MFr Bld: 5.5 % (ref 4.8–5.6)

## 2019-11-17 LAB — VITAMIN D 25 HYDROXY (VIT D DEFICIENCY, FRACTURES): Vit D, 25-Hydroxy: 20.1 ng/mL — ABNORMAL LOW (ref 30.0–100.0)

## 2019-11-17 LAB — TSH: TSH: 2.81 u[IU]/mL (ref 0.450–4.500)

## 2019-11-17 LAB — T3: T3, Total: 106 ng/dL (ref 71–180)

## 2019-11-17 LAB — VITAMIN B12: Vitamin B-12: 362 pg/mL (ref 232–1245)

## 2019-11-17 LAB — INSULIN, RANDOM: INSULIN: 20.5 u[IU]/mL (ref 2.6–24.9)

## 2019-11-30 ENCOUNTER — Other Ambulatory Visit: Payer: Self-pay

## 2019-11-30 ENCOUNTER — Encounter (INDEPENDENT_AMBULATORY_CARE_PROVIDER_SITE_OTHER): Payer: Self-pay | Admitting: Family Medicine

## 2019-11-30 ENCOUNTER — Ambulatory Visit (INDEPENDENT_AMBULATORY_CARE_PROVIDER_SITE_OTHER): Payer: 59 | Admitting: Family Medicine

## 2019-11-30 VITALS — BP 142/84 | HR 92 | Temp 98.1°F | Ht 62.0 in | Wt 168.0 lb

## 2019-11-30 DIAGNOSIS — Z6830 Body mass index (BMI) 30.0-30.9, adult: Secondary | ICD-10-CM

## 2019-11-30 DIAGNOSIS — E8881 Metabolic syndrome: Secondary | ICD-10-CM

## 2019-11-30 DIAGNOSIS — Z9189 Other specified personal risk factors, not elsewhere classified: Secondary | ICD-10-CM

## 2019-11-30 DIAGNOSIS — E559 Vitamin D deficiency, unspecified: Secondary | ICD-10-CM

## 2019-11-30 DIAGNOSIS — E6609 Other obesity due to excess calories: Secondary | ICD-10-CM

## 2019-11-30 DIAGNOSIS — I10 Essential (primary) hypertension: Secondary | ICD-10-CM | POA: Diagnosis not present

## 2019-12-01 ENCOUNTER — Encounter (INDEPENDENT_AMBULATORY_CARE_PROVIDER_SITE_OTHER): Payer: Self-pay | Admitting: Family Medicine

## 2019-12-01 MED ORDER — VITAMIN D (ERGOCALCIFEROL) 1.25 MG (50000 UNIT) PO CAPS: 50000.0000 [IU] | ORAL_CAPSULE | ORAL | 0 refills | Status: DC

## 2019-12-01 NOTE — Progress Notes (Signed)
Chief Complaint:   OBESITY Julia Campbell is here to discuss her progress with her obesity treatment plan along with follow-up of her obesity related diagnoses. Julia Campbell is on the Category 3 Plan and states she is following her eating plan approximately 95% of the time. Julia Campbell states she is walking 30 minutes 1 time per week.  Today's visit was #: 2 Starting weight: 174 lbs Starting date: 11/16/2019 Today's weight: 168 lbs Today's date: 11/30/2019 Total lbs lost to date: 6 Total lbs lost since last in-office visit: 6  Interim History: Julia Campbell is having a hard time not feeling disappointed with six pound weight loss. She has occasional hunger, but this is not unbearable. She is most hungry between lunch and dinner. She would do a tablespoon of peanut butter and string cheese for snacks. She did eggs for breakfast and a sandwich for lunch. Julia Campbell got in four to six ounces of protein at dinner.  Subjective:   Insulin resistance Julia Campbell has a new diagnosis of insulin resistance based on her elevated fasting insulin level >5. She has Hgb A1c of 5.5 and insulin level of 20.5 (11/16/19). Julia Campbell has some cravings, but this is controllable. She continues to work on diet and exercise to decrease her risk of diabetes. Labs were discussed with patient today.   Lab Results  Component Value Date   INSULIN 20.5 11/16/2019   Lab Results  Component Value Date   HGBA1C 5.5 11/16/2019   Essential hypertension Julia Campbell has worsening hypertension. Her blood pressure is slightly elevated today. She denies chest pain, chest pressure or headache. Julia Campbell is on Chlorthalidone 25 mg.  BP Readings from Last 3 Encounters:  11/30/19 (!) 142/84  11/16/19 138/85  10/28/19 123/82   Lab Results  Component Value Date   CREATININE 0.90 11/16/2019   CREATININE 0.65 10/28/2019   CREATININE 0.67 03/09/2019   Vitamin D deficiency Julia Campbell has a new diagnosis of vitamin D deficiency. Her vitamin D level was 20.1 on 11/16/19. She  is not currently taking vit D. She admits fatigue and denies nausea, vomiting or muscle weakness. Labs were discussed with patient today.  At risk for osteoporosis Julia Campbell is at higher risk of osteopenia and osteoporosis due to Vitamin D deficiency.   Assessment/Plan:   Insulin resistance Julia Campbell will continue to work on weight loss, exercise, and decreasing simple carbohydrates to help decrease the risk of diabetes. There is no need for medications at this time. We will revisit discussion of medications at the next appointment. Julia Campbell agreed to follow-up with Julia Campbell as directed to closely monitor her progress.  Essential hypertension [name] is working on healthy weight loss and exercise to improve blood pressure control. We will watch for signs of hypotension as she continues her lifestyle modifications. We will follow up at the next appointment.  Vitamin D deficiency  Low Vitamin D level contributes to fatigue and are associated with obesity, breast, and colon cancer. [name] agrees to start prescription Vitamin D @50 ,000 IU every week #4 with no refills and she will follow-up for routine testing of Vitamin D, at least 2-3 times per year to avoid over-replacement.  At risk for osteoporosis Julia Campbell was given approximately 15 minutes of osteoporosis prevention counseling today. Julia Campbell is at risk for osteopenia and osteoporosis due to her Vitamin D deficiency. She was encouraged to take her Vitamin D and follow her higher calcium diet and increase strengthening exercise to help strengthen her bones and decrease her risk of osteopenia and osteoporosis.  Repetitive spaced  learning was employed today to elicit superior memory formation and behavioral change.  Class 1 obesity due to excess calories without serious comorbidity with body mass index (BMI) of 30.0 to 30.9 in adult Julia Campbell is currently in the action stage of change. As such, her goal is to continue with weight loss efforts. She has agreed to the Category  3 Plan with breakfast and lunch options.   Behavioral modification strategies: increasing lean protein intake, increasing vegetables, meal planning and cooking strategies, keeping healthy foods in the home and planning for success.  Julia Campbell has agreed to follow-up with our clinic in 2 weeks. She was informed of the importance of frequent follow-up visits to maximize her success with intensive lifestyle modifications for her multiple health conditions.   Objective:   Blood pressure (!) 142/84, pulse 92, temperature 98.1 F (36.7 C), temperature source Oral, height 5\' 2"  (1.575 m), weight 168 lb (76.2 kg), last menstrual period 11/27/2019, SpO2 92 %. Body mass index is 30.73 kg/m.  General: Cooperative, alert, well developed, in no acute distress. HEENT: Conjunctivae and lids unremarkable. Cardiovascular: Regular rhythm.  Lungs: Normal work of breathing. Neurologic: No focal deficits.   Lab Results  Component Value Date   CREATININE 0.90 11/16/2019   BUN 10 11/16/2019   NA 138 11/16/2019   K 3.6 11/16/2019   CL 99 11/16/2019   CO2 23 11/16/2019   Lab Results  Component Value Date   ALT 11 11/16/2019   AST 12 11/16/2019   ALKPHOS 63 11/16/2019   BILITOT 0.3 11/16/2019   Lab Results  Component Value Date   HGBA1C 5.5 11/16/2019   Lab Results  Component Value Date   INSULIN 20.5 11/16/2019   Lab Results  Component Value Date   TSH 2.810 11/16/2019   Lab Results  Component Value Date   CHOL 234 (H) 10/28/2019   HDL 47 10/28/2019   LDLCALC 143 (H) 10/28/2019   TRIG 246 (H) 10/28/2019   Lab Results  Component Value Date   WBC 12.0 (H) 11/16/2019   HGB 13.7 11/16/2019   HCT 41.0 11/16/2019   MCV 92 11/16/2019   PLT 466 (H) 11/16/2019   No results found for: IRON, TIBC, FERRITIN   Ref. Range 11/16/2019 12:39  Vitamin D, 25-Hydroxy Latest Ref Range: 30.0 - 100.0 ng/mL 20.1 (L)    Attestation Statements:   Reviewed by clinician on day of visit: allergies,  medications, problem list, medical history, surgical history, family history, social history, and previous encounter notes.  I, Doreene Nest, am acting as transcriptionist for Julia Common, MD. I have reviewed the above documentation for accuracy and completeness, and I agree with the above. - Ilene Qua, MD

## 2019-12-22 ENCOUNTER — Ambulatory Visit (INDEPENDENT_AMBULATORY_CARE_PROVIDER_SITE_OTHER): Payer: 59 | Admitting: Bariatrics

## 2019-12-24 ENCOUNTER — Other Ambulatory Visit (INDEPENDENT_AMBULATORY_CARE_PROVIDER_SITE_OTHER): Payer: Self-pay | Admitting: Family Medicine

## 2019-12-24 DIAGNOSIS — E559 Vitamin D deficiency, unspecified: Secondary | ICD-10-CM

## 2019-12-30 ENCOUNTER — Ambulatory Visit (INDEPENDENT_AMBULATORY_CARE_PROVIDER_SITE_OTHER): Payer: 59 | Admitting: Bariatrics

## 2019-12-30 ENCOUNTER — Other Ambulatory Visit: Payer: Self-pay

## 2019-12-30 ENCOUNTER — Encounter (INDEPENDENT_AMBULATORY_CARE_PROVIDER_SITE_OTHER): Payer: Self-pay | Admitting: Bariatrics

## 2019-12-30 VITALS — BP 116/76 | HR 79 | Temp 97.9°F | Ht 62.0 in | Wt 161.0 lb

## 2019-12-30 DIAGNOSIS — E559 Vitamin D deficiency, unspecified: Secondary | ICD-10-CM | POA: Diagnosis not present

## 2019-12-30 DIAGNOSIS — Z6830 Body mass index (BMI) 30.0-30.9, adult: Secondary | ICD-10-CM

## 2019-12-30 DIAGNOSIS — E8881 Metabolic syndrome: Secondary | ICD-10-CM | POA: Diagnosis not present

## 2019-12-30 DIAGNOSIS — E6609 Other obesity due to excess calories: Secondary | ICD-10-CM | POA: Diagnosis not present

## 2019-12-30 MED ORDER — VITAMIN D (ERGOCALCIFEROL) 1.25 MG (50000 UNIT) PO CAPS: 50000.0000 [IU] | ORAL_CAPSULE | ORAL | 0 refills | Status: DC

## 2019-12-30 NOTE — Progress Notes (Signed)
Chief Complaint:   OBESITY Julia Campbell is here to discuss her progress with her obesity treatment plan along with follow-up of her obesity related diagnoses. Julia Campbell is on the Category 3 Plan and states she is following her eating plan approximately 90% of the time. Julia Campbell states she is walking 60 minutes 1 time per week.  Today's visit was #: 3 Starting weight: 174 lbs Starting date: 11/16/2019 Today's weight: 161 lbs Today's date: 12/30/2019 Total lbs lost to date: 13 Total lbs lost since last in-office visit: 7  Interim History: Julia Campbell reports the Category 3 meal plan has been working well and she has been increasing her water intake slowly daily.  Subjective:   Vitamin D deficiency. [name] reports a slight increase in her energy level since starting prescription strength Vitamin D. Last Vitamin D 20.1 on 11/16/2019.  Insulin resistance. [name] has a diagnosis of insulin resistance based on her elevated fasting insulin level >5. She continues to work on diet and exercise to decrease her risk of diabetes. She is not on metformin.  Lab Results  Component Value Date   INSULIN 20.5 11/16/2019   Lab Results  Component Value Date   HGBA1C 5.5 11/16/2019   Assessment/Plan:   Vitamin D deficiency. Low Vitamin D level contributes to fatigue and are associated with obesity, breast, and colon cancer. She was given a refill on her Vitamin D, Ergocalciferol, (DRISDOL) 1.25 MG (50000 UNIT) CAPS capsule every week #4 with 0 refills and will follow-up for routine testing of Vitamin D, at least 2-3 times per year to avoid over-replacement.     Insulin resistance. Julia Campbell will continue to work on weight loss, exercise, and decreasing simple carbohydrates to help decrease the risk of diabetes. Julia Campbell agreed to follow-up with [name] as directed to closely monitor her progress. Will check A1c and insulin levels in 3 months. She will continue the Category 3 meal plan.  Class 1 obesity due to excess  calories without serious comorbidity with body mass index (BMI) of 30.0 to 30.9 in adult.  Julia Campbell is currently in the action stage of change. As such, her goal is to continue with weight loss efforts. She has agreed to the Category 3 Plan with additional breakfast options.   Exercise goals: Julia Campbell will continue her current exercise regimen.  Behavioral modification strategies: increasing lean protein intake, increasing water intake and no skipping meals.  Julia Campbell has agreed to follow-up with our clinic in 2-3 weeks. She was informed of the importance of frequent follow-up visits to maximize her success with intensive lifestyle modifications for her multiple health conditions.   Objective:   Blood pressure 116/76, pulse 79, temperature 97.9 F (36.6 C), height 5\' 2"  (1.575 m), weight 161 lb (73 kg), last menstrual period 11/25/2019, SpO2 97 %. Body mass index is 29.45 kg/m.  General: Cooperative, alert, well developed, in no acute distress. HEENT: Conjunctivae and lids unremarkable. Cardiovascular: Regular rhythm.  Lungs: Normal work of breathing. Neurologic: No focal deficits.   Lab Results  Component Value Date   CREATININE 0.90 11/16/2019   BUN 10 11/16/2019   NA 138 11/16/2019   K 3.6 11/16/2019   CL 99 11/16/2019   CO2 23 11/16/2019   Lab Results  Component Value Date   ALT 11 11/16/2019   AST 12 11/16/2019   ALKPHOS 63 11/16/2019   BILITOT 0.3 11/16/2019   Lab Results  Component Value Date   HGBA1C 5.5 11/16/2019   Lab Results  Component Value Date  INSULIN 20.5 11/16/2019   Lab Results  Component Value Date   TSH 2.810 11/16/2019   Lab Results  Component Value Date   CHOL 234 (H) 10/28/2019   HDL 47 10/28/2019   LDLCALC 143 (H) 10/28/2019   TRIG 246 (H) 10/28/2019   Lab Results  Component Value Date   WBC 12.0 (H) 11/16/2019   HGB 13.7 11/16/2019   HCT 41.0 11/16/2019   MCV 92 11/16/2019   PLT 466 (H) 11/16/2019   No results found for: IRON, TIBC,  FERRITIN  Attestation Statements:   Reviewed by clinician on day of visit: allergies, medications, problem list, medical history, surgical history, family history, social history, and previous encounter notes.  Migdalia Dk, am acting as Location manager for [employer], DO   I have reviewed the above documentation for accuracy and completeness, and I agree with the above. Jearld Lesch, DO

## 2020-01-19 ENCOUNTER — Other Ambulatory Visit: Payer: Self-pay

## 2020-01-19 ENCOUNTER — Ambulatory Visit (INDEPENDENT_AMBULATORY_CARE_PROVIDER_SITE_OTHER): Payer: 59 | Admitting: Family Medicine

## 2020-01-19 ENCOUNTER — Encounter (INDEPENDENT_AMBULATORY_CARE_PROVIDER_SITE_OTHER): Payer: Self-pay | Admitting: Family Medicine

## 2020-01-19 VITALS — BP 113/74 | HR 73 | Temp 98.1°F | Ht 62.0 in | Wt 159.0 lb

## 2020-01-19 DIAGNOSIS — I10 Essential (primary) hypertension: Secondary | ICD-10-CM | POA: Diagnosis not present

## 2020-01-19 DIAGNOSIS — E559 Vitamin D deficiency, unspecified: Secondary | ICD-10-CM

## 2020-01-19 DIAGNOSIS — E6609 Other obesity due to excess calories: Secondary | ICD-10-CM | POA: Diagnosis not present

## 2020-01-19 DIAGNOSIS — Z6830 Body mass index (BMI) 30.0-30.9, adult: Secondary | ICD-10-CM

## 2020-01-19 DIAGNOSIS — Z9189 Other specified personal risk factors, not elsewhere classified: Secondary | ICD-10-CM

## 2020-01-19 MED ORDER — VITAMIN D (ERGOCALCIFEROL) 1.25 MG (50000 UNIT) PO CAPS: 50000.0000 [IU] | ORAL_CAPSULE | ORAL | 0 refills | Status: DC

## 2020-01-20 NOTE — Progress Notes (Signed)
Chief Complaint:   OBESITY Julia Campbell is here to discuss her progress with her obesity treatment plan along with follow-up of her obesity related diagnoses. Julia Campbell is on the Category 3 Plan and states she is following her eating plan approximately 85-90% of the time. Julia Campbell states she is walking 90 minutes per week.  Today's visit was #: 4 Starting weight: 174 lbs Starting date: 11/16/2019 Today's weight: 159 lbs Today's date: 01/19/2020 Total lbs lost to date: 15 Total lbs lost since last in-office visit: 2  Interim History: Julia Campbell went to [facility] and did eat out. She tried to make healthier decisions. Denies hunger. She is doing string cheese with salsa for a snack or will do something sweet - 1/2 brownie or a small cookie. She reports getting in 4-6 oz at dinner and 3-4 oz at lunch.  Subjective:   Essential hypertension. Blood pressure is well controlled on chlorthalidone.  BP Readings from Last 3 Encounters:  01/19/20 113/74  12/30/19 116/76  11/30/19 (!) 142/84   Lab Results  Component Value Date   CREATININE 0.90 11/16/2019   CREATININE 0.65 10/28/2019   CREATININE 0.67 03/09/2019   Vitamin D deficiency. No nausea, vomiting, or muscle weakness. Julia Campbell endorses fatigue. She is on prescription Vitamin D. Last Vitamin D 20.1 on 11/16/2019.  At risk for osteoporosis. Julia Campbell is at higher risk of osteopenia and osteoporosis due to Vitamin D deficiency.   Assessment/Plan:   Essential hypertension. Julia Campbell is working on healthy weight loss and exercise to improve blood pressure control. We will watch for signs of hypotension as she continues her lifestyle modifications. She will continue her medication as directed with no change in dosage.  Vitamin D deficiency. Low Vitamin D level contributes to fatigue and are associated with obesity, breast, and colon cancer. She was given a refill on her Vitamin D, Ergocalciferol, (DRISDOL) 1.25 MG (50000 UNIT) CAPS capsule every week  #4 with 0 refills and will follow-up for routine testing of Vitamin D, at least 2-3 times per year to avoid over-replacement.     At risk for osteoporosis. Carina was given approximately 15 minutes of osteoporosis prevention counseling today. Mallissa is at risk for osteopenia and osteoporosis due to her Vitamin D deficiency. She was encouraged to take her Vitamin D and follow her higher calcium diet and increase strengthening exercise to help strengthen her bones and decrease her risk of osteopenia and osteoporosis.  Repetitive spaced learning was employed today to elicit superior memory formation and behavioral change.  Class 1 obesity due to excess calories without serious comorbidity with body mass index (BMI) of 30.0 to 30.9 in adult - BMI greater than 30 at the start of the program.  Zaviera is currently in the action stage of change. As such, her goal is to continue with weight loss efforts. She has agreed to the Category 3 Plan and will journal 1400-1550 calories and 100+ grams of protein daily.   Exercise goals: Lollie will continue her current exercise regimen.  Behavioral modification strategies: increasing lean protein intake, meal planning and cooking strategies, keeping healthy foods in the home, better snacking choices and planning for success.  Fleshia has agreed to follow-up with our clinic in 2 weeks. She was informed of the importance of frequent follow-up visits to maximize her success with intensive lifestyle modifications for her multiple health conditions.   Objective:   Blood pressure 113/74, pulse 73, temperature 98.1 F (36.7 C), temperature source Oral, height 5\' 2"  (1.575 m),  weight 159 lb (72.1 kg), last menstrual period 01/16/2020, SpO2 95 %. Body mass index is 29.08 kg/m.  General: Cooperative, alert, well developed, in no acute distress. HEENT: Conjunctivae and lids unremarkable. Cardiovascular: Regular rhythm.  Lungs: Normal work of breathing. Neurologic: No focal  deficits.   Lab Results  Component Value Date   CREATININE 0.90 11/16/2019   BUN 10 11/16/2019   NA 138 11/16/2019   K 3.6 11/16/2019   CL 99 11/16/2019   CO2 23 11/16/2019   Lab Results  Component Value Date   ALT 11 11/16/2019   AST 12 11/16/2019   ALKPHOS 63 11/16/2019   BILITOT 0.3 11/16/2019   Lab Results  Component Value Date   HGBA1C 5.5 11/16/2019   Lab Results  Component Value Date   INSULIN 20.5 11/16/2019   Lab Results  Component Value Date   TSH 2.810 11/16/2019   Lab Results  Component Value Date   CHOL 234 (H) 10/28/2019   HDL 47 10/28/2019   LDLCALC 143 (H) 10/28/2019   TRIG 246 (H) 10/28/2019   Lab Results  Component Value Date   WBC 12.0 (H) 11/16/2019   HGB 13.7 11/16/2019   HCT 41.0 11/16/2019   MCV 92 11/16/2019   PLT 466 (H) 11/16/2019   No results found for: IRON, TIBC, FERRITIN  to schedule her next follow up appointment today.  Attestation Statements:   Reviewed by clinician on day of visit: allergies, medications, problem list, medical history, surgical history, family history, social history, and previous encounter notes.  I, Michaelene Song, am acting as transcriptionist for Coralie Common, MD   I have reviewed the above documentation for accuracy and completeness, and I agree with the above. - Jinny Blossom, MD

## 2020-02-02 ENCOUNTER — Ambulatory Visit (INDEPENDENT_AMBULATORY_CARE_PROVIDER_SITE_OTHER): Payer: 59 | Admitting: Physician Assistant

## 2020-02-03 ENCOUNTER — Encounter (INDEPENDENT_AMBULATORY_CARE_PROVIDER_SITE_OTHER): Payer: Self-pay | Admitting: Family Medicine

## 2020-02-03 NOTE — Telephone Encounter (Signed)
Please advise 

## 2020-03-16 ENCOUNTER — Encounter (INDEPENDENT_AMBULATORY_CARE_PROVIDER_SITE_OTHER): Payer: Self-pay | Admitting: Family Medicine

## 2020-03-16 NOTE — Telephone Encounter (Signed)
Please advise 

## 2020-04-29 ENCOUNTER — Encounter: Payer: Self-pay | Admitting: Nurse Practitioner

## 2020-04-29 ENCOUNTER — Other Ambulatory Visit: Payer: Self-pay

## 2020-04-29 ENCOUNTER — Ambulatory Visit (INDEPENDENT_AMBULATORY_CARE_PROVIDER_SITE_OTHER): Payer: PRIVATE HEALTH INSURANCE | Admitting: Nurse Practitioner

## 2020-04-29 VITALS — BP 120/78 | HR 85 | Temp 98.8°F | Wt 166.2 lb

## 2020-04-29 DIAGNOSIS — I10 Essential (primary) hypertension: Secondary | ICD-10-CM

## 2020-04-29 DIAGNOSIS — E6609 Other obesity due to excess calories: Secondary | ICD-10-CM

## 2020-04-29 DIAGNOSIS — E559 Vitamin D deficiency, unspecified: Secondary | ICD-10-CM

## 2020-04-29 DIAGNOSIS — E782 Mixed hyperlipidemia: Secondary | ICD-10-CM

## 2020-04-29 DIAGNOSIS — Z6830 Body mass index (BMI) 30.0-30.9, adult: Secondary | ICD-10-CM

## 2020-04-29 DIAGNOSIS — F325 Major depressive disorder, single episode, in full remission: Secondary | ICD-10-CM

## 2020-04-29 MED ORDER — VITAMIN D (ERGOCALCIFEROL) 1.25 MG (50000 UNIT) PO CAPS: 50000.0000 [IU] | ORAL_CAPSULE | ORAL | 4 refills | Status: DC

## 2020-04-29 NOTE — Assessment & Plan Note (Signed)
Chronic, stable with BP at goal.  Continue current medication regimen and adjust as needed.  BP at goal today in office.  Check BMP.  Recommend occasional BP checks at home and documenting these for provider.  Return in 6 months.

## 2020-04-29 NOTE — Assessment & Plan Note (Signed)
BMI >30 she is focused on weight loss and praised for this.  Recommended eating smaller high protein, low fat meals more frequently and exercising 30 mins a day 5 times a week with a goal of 10-15lb weight loss in the next 3 months. Patient voiced their understanding and motivation to adhere to these recommendations.

## 2020-04-29 NOTE — Progress Notes (Addendum)
BP 120/78   Pulse 85   Temp 98.8 F (37.1 C) (Oral)   Wt 166 lb 3.2 oz (75.4 kg)   LMP 04/03/2020 (Approximate)   SpO2 98%   BMI 30.40 kg/m    Subjective:    Patient ID: Julia Campbell, female    DOB: [birth year], 44 y.o.   MRN: [id number]  HPI: Julia Campbell is a 44 y.o. female  Chief Complaint  Patient presents with  . Depression  . Hypertension   HYPERTENSION / HYPERLIPIDEMIA Currently on [place] and Lopid (only takes once a day).She is being followed by weight management, last saw on 01/19/2020 -- then started new job, but did not have insurance -- does not plan on returning.    Vitamin D deficiency noted in March -- 20.1.   Satisfied with current treatment? yes Duration of hypertension: chronic BP monitoring frequency: not checking BP range:  BP medication side effects: no Duration of hyperlipidemia: chronic Cholesterol medication side effects: no Cholesterol supplements: none Medication compliance: good compliance Aspirin: no Recent stressors: no Recurrent headaches: no Visual changes: no Palpitations: no Dyspnea: no Chest pain: no Lower extremity edema: no Dizzy/lightheaded: no  The 10-year ASCVD risk score Mikey Bussing DC Jr., et al., 2013) is: 1.3%   Values used to calculate the score:     Age: 32 years     Sex: Female     Is Non-Hispanic African American: No     Diabetic: No     Tobacco smoker: No     Systolic Blood Pressure: 132 mmHg     Is BP treated: Yes     HDL Cholesterol: 55 mg/dL     Total Cholesterol: 224 mg/dL  DEPRESSION Continues on Wellbutrin and reports good control. Mood status:stable Satisfied with current treatment?:no Symptom severity:mild Duration of current treatment :chronic Side effects:no Medication compliance:good compliance Psychotherapy/counseling:none Previous psychiatric medications:Wellbutrin Anxious mood:no Anhedonia:no Significant weight loss or gain:no Insomnia:none Fatigue:no Feelings of  worthlessness or guilt:no Impaired concentration/indecisiveness:no Suicidal ideations:no Hopelessness:no Crying spells:no Depression screen [facility] 2/9 04/29/2020 11/16/2019 10/28/2019 10/28/2019 03/09/2019  Decreased Interest 0 2 0 0 0  Down, Depressed, Hopeless 0 3 1 1 1   PHQ - 2 Score 0 5 1 1 1   Altered sleeping 0 0 0 0 0  Tired, decreased energy 0 2 0 0 0  Change in appetite 0 2 1 1 2   Feeling bad or failure about yourself  0 3 1 1 2   Trouble concentrating 0 0 0 0 0  Moving slowly or fidgety/restless 0 0 0 0 0  Suicidal thoughts 0 1 0 0 0  PHQ-9 Score 0 13 3 3 5   Difficult doing work/chores Not difficult at all Not difficult at all - - Not difficult at all  Some recent data might be hidden    Relevant past medical, surgical, family and social history reviewed and updated as indicated. Interim medical history since our last visit reviewed. Allergies and medications reviewed and updated.  Review of Systems  Constitutional: Negative for activity change, appetite change, diaphoresis, fatigue and fever.  Respiratory: Negative for cough, chest tightness and shortness of breath.   Cardiovascular: Negative for chest pain, palpitations and leg swelling.  Gastrointestinal: Negative.   Endocrine: Negative.   Neurological: Negative.   Psychiatric/Behavioral: Negative.     Per HPI unless specifically indicated above     Objective:    BP 120/78   Pulse 85   Temp 98.8 F (37.1 C) (Oral)   Wt 166 lb 3.2 oz (75.4  kg)   LMP 04/03/2020 (Approximate)   SpO2 98%   BMI 30.40 kg/m   Wt Readings from Last 3 Encounters:  04/29/20 166 lb 3.2 oz (75.4 kg)  01/19/20 159 lb (72.1 kg)  12/30/19 161 lb (73 kg)    Physical Exam Vitals and nursing note reviewed.  Constitutional:      General: She is awake. She is not in acute distress.    Appearance: She is well-developed and well-groomed. She is obese. She is not ill-appearing.  HENT:     Head: Normocephalic.     Right Ear: Hearing normal.      Left Ear: Hearing normal.  Eyes:     General: Lids are normal.        Right eye: No discharge.        Left eye: No discharge.     Conjunctiva/sclera: Conjunctivae normal.     Pupils: Pupils are equal, round, and reactive to light.  Neck:     Thyroid: No thyromegaly.     Vascular: No carotid bruit.  Cardiovascular:     Rate and Rhythm: Normal rate and regular rhythm.     Heart sounds: Normal heart sounds. No murmur heard.  No gallop.   Pulmonary:     Effort: Pulmonary effort is normal. No accessory muscle usage or respiratory distress.     Breath sounds: Normal breath sounds.  Abdominal:     General: Bowel sounds are normal.     Palpations: Abdomen is soft.  Musculoskeletal:     Cervical back: Normal range of motion and neck supple.     Right lower leg: No edema.     Left lower leg: No edema.  Skin:    General: Skin is warm and dry.  Neurological:     Mental Status: She is alert and oriented to person, place, and time.  Psychiatric:        Attention and Perception: Attention normal.        Mood and Affect: Mood normal.        Behavior: Behavior normal. Behavior is cooperative.        Thought Content: Thought content normal.        Judgment: Judgment normal.     Results for orders placed or performed in visit on 29/93/71  Basic metabolic panel  Result Value Ref Range   Glucose 80 65 - 99 mg/dL   BUN 13 6 - 24 mg/dL   Creatinine, Ser 0.78 0.57 - 1.00 mg/dL   GFR calc non Af Amer 91 >59 mL/min/1.73   GFR calc Af Amer 105 >59 mL/min/1.73   BUN/Creatinine Ratio 17 9 - 23   Sodium 139 134 - 144 mmol/L   Potassium 3.6 3.5 - 5.2 mmol/L   Chloride 100 96 - 106 mmol/L   CO2 25 20 - 29 mmol/L   Calcium 9.4 8.7 - 10.2 mg/dL  Lipid Panel w/o Chol/HDL Ratio  Result Value Ref Range   Cholesterol, Total 224 (H) 100 - 199 mg/dL   Triglycerides 134 0 - 149 mg/dL   HDL 55 >39 mg/dL   VLDL Cholesterol Cal 24 5 - 40 mg/dL   LDL Chol Calc (NIH) 145 (H) 0 - 99 mg/dL  VITAMIN D  25 Hydroxy (Vit-D Deficiency, Fractures)  Result Value Ref Range   Vit D, 25-Hydroxy 35.7 30.0 - 100.0 ng/mL  TSH  Result Value Ref Range   TSH 2.500 0.450 - 4.500 uIU/mL      Assessment & Plan:  Problem List Items Addressed This Visit      Cardiovascular and Mediastinum   Essential hypertension    Chronic, stable with BP at goal.  Continue current medication regimen and adjust as needed.  BP at goal today in office.  Check BMP.  Recommend occasional BP checks at home and documenting these for provider.  Return in 6 months.      Relevant Orders   Basic metabolic panel (Completed)   TSH (Completed)     Other   Hyperlipidemia    Chronic, ongoing.  Continue current medication regimen and adjust as needed.  Lipid panel today.  Return in 6 months.      Relevant Orders   Lipid Panel w/o Chol/HDL Ratio (Completed)   Obesity    BMI >30 she is focused on weight loss and praised for this.  Recommended eating smaller high protein, low fat meals more frequently and exercising 30 mins a day 5 times a week with a goal of 10-15lb weight loss in the next 3 months. Patient voiced their understanding and motivation to adhere to these recommendations.       Depression, major, single episode, complete remission (HCC) - Primary    Chronic, stable.  Denies SI/HI.  Continue Wellbutrin and adjust regimen as needed.  PHQ9 - 0, down from 3 on previous.  Return in 6 months.      Vitamin D deficiency    Ongoing, will recheck Vit D level today.  Continue supplement, refills sent, if improved will consider change to OTC supplement in 8 weeks.      Relevant Medications   Vitamin D, Ergocalciferol, (DRISDOL) 1.25 MG (50000 UNIT) CAPS capsule   Other Relevant Orders   VITAMIN D 25 Hydroxy (Vit-D Deficiency, Fractures) (Completed)       Follow up plan: Return in about 6 months (around 10/30/2020) for HTN/HLD, MOOD.

## 2020-04-29 NOTE — Patient Instructions (Signed)
Healthy Eating Following a healthy eating pattern may help you to achieve and maintain a healthy body weight, reduce the risk of chronic disease, and live a long and productive life. It is important to follow a healthy eating pattern at an appropriate calorie level for your body. Your nutritional needs should be met primarily through food by choosing a variety of nutrient-rich foods. What are tips for following this plan? Reading food labels  Read labels and choose the following: ? Reduced or low sodium. ? Juices with 100% fruit juice. ? Foods with low saturated fats and high polyunsaturated and monounsaturated fats. ? Foods with whole grains, such as whole wheat, cracked wheat, brown rice, and wild rice. ? Whole grains that are fortified with folic acid. This is recommended for women who are pregnant or who want to become pregnant.  Read labels and avoid the following: ? Foods with a lot of added sugars. These include foods that contain brown sugar, corn sweetener, corn syrup, dextrose, fructose, glucose, high-fructose corn syrup, honey, invert sugar, lactose, malt syrup, maltose, molasses, raw sugar, sucrose, trehalose, or turbinado sugar.  Do not eat more than the following amounts of added sugar per day:  6 teaspoons (25 g) for women.  9 teaspoons (38 g) for men. ? Foods that contain processed or refined starches and grains. ? Refined grain products, such as white flour, degermed cornmeal, white bread, and white rice. Shopping  Choose nutrient-rich snacks, such as vegetables, whole fruits, and nuts. Avoid high-calorie and high-sugar snacks, such as potato chips, fruit snacks, and candy.  Use oil-based dressings and spreads on foods instead of solid fats such as butter, stick margarine, or cream cheese.  Limit pre-made sauces, mixes, and "instant" products such as flavored rice, instant noodles, and ready-made pasta.  Try more plant-protein sources, such as tofu, tempeh, black beans,  edamame, lentils, nuts, and seeds.  Explore eating plans such as the Mediterranean diet or vegetarian diet. Cooking  Use oil to saut or stir-fry foods instead of solid fats such as butter, stick margarine, or lard.  Try baking, boiling, grilling, or broiling instead of frying.  Remove the fatty part of meats before cooking.  Steam vegetables in water or broth. Meal planning   At meals, imagine dividing your plate into fourths: ? One-half of your plate is fruits and vegetables. ? One-fourth of your plate is whole grains. ? One-fourth of your plate is protein, especially lean meats, poultry, eggs, tofu, beans, or nuts.  Include low-fat dairy as part of your daily diet. Lifestyle  Choose healthy options in all settings, including home, work, school, restaurants, or stores.  Prepare your food safely: ? Wash your hands after handling raw meats. ? Keep food preparation surfaces clean by regularly washing with hot, soapy water. ? Keep raw meats separate from ready-to-eat foods, such as fruits and vegetables. ? Cook seafood, meat, poultry, and eggs to the recommended internal temperature. ? Store foods at safe temperatures. In general:  Keep cold foods at 59F (4.4C) or below.  Keep hot foods at 159F (60C) or above.  Keep your freezer at [facility] (-17.8C) or below.  Foods are no longer safe to eat when they have been between the temperatures of 40-159F (4.4-60C) for more than 2 hours. What foods should I eat? Fruits Aim to eat 2 cup-equivalents of fresh, canned (in natural juice), or frozen fruits each day. Examples of 1 cup-equivalent of fruit include 1 small apple, 8 large strawberries, 1 cup canned fruit,  cup  dried fruit, or 1 cup 100% juice. Vegetables Aim to eat 2-3 cup-equivalents of fresh and frozen vegetables each day, including different varieties and colors. Examples of 1 cup-equivalent of vegetables include 2 medium carrots, 2 cups raw, leafy greens, 1 cup chopped  vegetable (raw or cooked), or 1 medium baked potato. Grains Aim to eat 6 ounce-equivalents of whole grains each day. Examples of 1 ounce-equivalent of grains include 1 slice of bread, 1 cup ready-to-eat cereal, 3 cups popcorn, or  cup cooked rice, pasta, or cereal. Meats and other proteins Aim to eat 5-6 ounce-equivalents of protein each day. Examples of 1 ounce-equivalent of protein include 1 egg, 1/2 cup nuts or seeds, or 1 tablespoon (16 g) peanut butter. A cut of meat or fish that is the size of a deck of cards is about 3-4 ounce-equivalents.  Of the protein you eat each week, try to have at least 8 ounces come from seafood. This includes salmon, trout, herring, and anchovies. Dairy Aim to eat 3 cup-equivalents of fat-free or low-fat dairy each day. Examples of 1 cup-equivalent of dairy include 1 cup (240 mL) milk, 8 ounces (250 g) yogurt, 1 ounces (44 g) natural cheese, or 1 cup (240 mL) fortified soy milk. Fats and oils  Aim for about 5 teaspoons (21 g) per day. Choose monounsaturated fats, such as canola and olive oils, avocados, peanut butter, and most nuts, or polyunsaturated fats, such as sunflower, corn, and soybean oils, walnuts, pine nuts, sesame seeds, sunflower seeds, and flaxseed. Beverages  Aim for six 8-oz glasses of water per day. Limit coffee to three to five 8-oz cups per day.  Limit caffeinated beverages that have added calories, such as soda and energy drinks.  Limit alcohol intake to no more than 1 drink a day for nonpregnant women and 2 drinks a day for men. One drink equals 12 oz of beer (355 mL), 5 oz of wine (148 mL), or 1 oz of hard liquor (44 mL). Seasoning and other foods  Avoid adding excess amounts of salt to your foods. Try flavoring foods with herbs and spices instead of salt.  Avoid adding sugar to foods.  Try using oil-based dressings, sauces, and spreads instead of solid fats. This information is based on general [place] nutrition guidelines. For more  information, visit [employer]. Exact amounts may vary based on your nutrition needs. Summary  A healthy eating plan may help you to maintain a healthy weight, reduce the risk of chronic diseases, and stay active throughout your life.  Plan your meals. Make sure you eat the right portions of a variety of nutrient-rich foods.  Try baking, boiling, grilling, or broiling instead of frying.  Choose healthy options in all settings, including home, work, school, restaurants, or stores. This information is not intended to replace advice given to you by your health care provider. Make sure you discuss any questions you have with your health care provider. Document Revised: 12/16/2017 Document Reviewed: 12/16/2017 Elsevier Patient Education  [place].

## 2020-04-29 NOTE — Assessment & Plan Note (Signed)
Chronic, stable.  Denies SI/HI.  Continue Wellbutrin and adjust regimen as needed.  PHQ9 - 0, down from 3 on previous.  Return in 6 months.

## 2020-04-29 NOTE — Assessment & Plan Note (Signed)
Ongoing, will recheck Vit D level today.  Continue supplement, refills sent, if improved will consider change to OTC supplement in 8 weeks.

## 2020-04-29 NOTE — Assessment & Plan Note (Signed)
Chronic, ongoing.  Continue current medication regimen and adjust as needed.  Lipid panel today.  Return in 6 months. 

## 2020-04-30 LAB — BASIC METABOLIC PANEL
BUN/Creatinine Ratio: 17 (ref 9–23)
BUN: 13 mg/dL (ref 6–24)
CO2: 25 mmol/L (ref 20–29)
Calcium: 9.4 mg/dL (ref 8.7–10.2)
Chloride: 100 mmol/L (ref 96–106)
Creatinine, Ser: 0.78 mg/dL (ref 0.57–1.00)
GFR calc Af Amer: 105 mL/min/{1.73_m2} (ref >59)
GFR calc non Af Amer: 91 mL/min/{1.73_m2} (ref >59)
Glucose: 80 mg/dL (ref 65–99)
Potassium: 3.6 mmol/L (ref 3.5–5.2)
Sodium: 139 mmol/L (ref 134–144)

## 2020-04-30 LAB — TSH: TSH: 2.5 u[IU]/mL (ref 0.450–4.500)

## 2020-04-30 LAB — LIPID PANEL W/O CHOL/HDL RATIO
Cholesterol, Total: 224 mg/dL — ABNORMAL HIGH (ref 100–199)
HDL: 55 mg/dL (ref >39)
LDL Chol Calc (NIH): 145 mg/dL — ABNORMAL HIGH (ref 0–99)
Triglycerides: 134 mg/dL (ref 0–149)
VLDL Cholesterol Cal: 24 mg/dL (ref 5–40)

## 2020-04-30 LAB — VITAMIN D 25 HYDROXY (VIT D DEFICIENCY, FRACTURES): Vit D, 25-Hydroxy: 35.7 ng/mL (ref 30.0–100.0)

## 2020-05-01 NOTE — Progress Notes (Signed)
Contacted via [place] afternoon Julia Campbell, your labs have returned.  Vitamin D level has improved, continue daily supplement.  Kidney function and electrolytes are normal.  Cholesterol levels remain above goal, continue your daily Lopid.  Any questions? Keep being awesome!! Kindest regards, Deja Pisarski

## 2020-06-20 ENCOUNTER — Ambulatory Visit (INDEPENDENT_AMBULATORY_CARE_PROVIDER_SITE_OTHER): Payer: 59 | Admitting: Obstetrics & Gynecology

## 2020-06-20 ENCOUNTER — Encounter: Payer: Self-pay | Admitting: Obstetrics & Gynecology

## 2020-06-20 ENCOUNTER — Other Ambulatory Visit: Payer: Self-pay

## 2020-06-20 ENCOUNTER — Other Ambulatory Visit (HOSPITAL_COMMUNITY)
Admission: RE | Admit: 2020-06-20 | Discharge: 2020-06-20 | Disposition: A | Discharge status: Home / Self Care | Payer: PRIVATE HEALTH INSURANCE | Source: Ambulatory Visit | Attending: Obstetrics & Gynecology | Admitting: Obstetrics & Gynecology

## 2020-06-20 VITALS — BP 142/80 | Ht 62.5 in | Wt 171.0 lb

## 2020-06-20 DIAGNOSIS — Z01419 Encounter for gynecological examination (general) (routine) without abnormal findings: Secondary | ICD-10-CM

## 2020-06-20 DIAGNOSIS — D251 Intramural leiomyoma of uterus: Secondary | ICD-10-CM | POA: Diagnosis not present

## 2020-06-20 DIAGNOSIS — E669 Obesity, unspecified: Secondary | ICD-10-CM

## 2020-06-20 DIAGNOSIS — Z1231 Encounter for screening mammogram for malignant neoplasm of breast: Secondary | ICD-10-CM | POA: Diagnosis not present

## 2020-06-20 DIAGNOSIS — N870 Mild cervical dysplasia: Secondary | ICD-10-CM | POA: Insufficient documentation

## 2020-06-20 MED ORDER — PHENTERMINE HCL 37.5 MG PO TABS: ORAL_TABLET | ORAL | 0 refills | Status: DC

## 2020-06-20 NOTE — Patient Instructions (Signed)
PAP every three years Mammogram every year    Call [phone] to schedule at [facility] yearly (with PCP)  Thank you for choosing [facility]. As part of our ongoing efforts to improve patient experience, we would appreciate your feedback. Please fill out the short survey that you will receive by mail or MyChart. Your opinion is important to [name]! - Dr. Kenton Kingfisher    Phentermine tablets or capsules What is this medicine? PHENTERMINE (FEN ter meen) decreases your appetite. It is used with a reduced calorie diet and exercise to help you lose weight. This medicine may be used for other purposes; ask your health care provider or pharmacist if you have questions. COMMON BRAND NAME(S): Adipex-P, Atti-Plex P, Atti-Plex P Spansule, Fastin, Lomaira, Pro-Fast, Tara-8 What should I tell my health care provider before I take this medicine? They need to know if you have any of these conditions:  agitation or nervousness  diabetes  glaucoma  heart disease  high blood pressure  history of drug abuse or addiction  history of stroke  kidney disease  lung disease called Primary Pulmonary Hypertension (PPH)  taken an MAOI like Carbex, Eldepryl, Marplan, Nardil, or Parnate in last 14 days  taking stimulant medicines for attention disorders, weight loss, or to stay awake  thyroid disease  an unusual or allergic reaction to phentermine, other medicines, foods, dyes, or preservatives  pregnant or trying to get pregnant  breast-feeding How should I use this medicine? Take this medicine by mouth with a glass of water. Follow the directions on the prescription label. Take your medicine at regular intervals. Do not take it more often than directed. Do not stop taking except on your doctor's advice. Talk to your pediatrician regarding the use of this medicine in children. While this drug may be prescribed for children 17 years or older for selected conditions, precautions do  apply. Overdosage: If you think you have taken too much of this medicine contact a poison control center or emergency room at once. NOTE: This medicine is only for you. Do not share this medicine with others. What if I miss a dose? If you miss a dose, take it as soon as you can. If it is almost time for your next dose, take only that dose. Do not take double or extra doses. What may interact with this medicine? Do not take this medicine with any of the following medications:  MAOIs like Carbex, Eldepryl, Marplan, Nardil, and Parnate This medicine may also interact with the following medications:  alcohol  certain medicines for depression, anxiety, or psychotic disorders  certain medicines for high blood pressure  linezolid  medicines for colds or breathing difficulties like pseudoephedrine or phenylephrine  medicines for diabetes  sibutramine  stimulant medicines for attention disorders, weight loss, or to stay awake This list may not describe all possible interactions. Give your health care provider a list of all the medicines, herbs, non-prescription drugs, or dietary supplements you use. Also tell them if you smoke, drink alcohol, or use illegal drugs. Some items may interact with your medicine. What should I watch for while using this medicine? Visit your doctor or health care provider for regular checks on your progress. Do not stop taking except on your health care provider's advice. You may develop a severe reaction. Your health care provider will tell you how much medicine to take. Do not take this medicine close to bedtime. It may prevent you from sleeping. You may get drowsy or dizzy. Do not drive,  use machinery, or do anything that needs mental alertness until you know how this medicine affects you. Do not stand or sit up quickly, especially if you are an older patient. This reduces the risk of dizzy or fainting spells. Alcohol may increase dizziness and drowsiness. Avoid  alcoholic drinks. This medicine may affect blood sugar levels. Ask your healthcare provider if changes in diet or medicines are needed if you have diabetes. Women should inform their health care provider if they wish to become pregnant or think they might be pregnant. Losing weight while pregnant is not advised and may cause harm to the unborn child. Talk to your health care provider for more information. What side effects may I notice from receiving this medicine? Side effects that you should report to your doctor or health care professional as soon as possible:  allergic reactions like skin rash, itching or hives, swelling of the face, lips, or tongue  breathing problems  changes in emotions or moods  changes in vision  chest pain or chest tightness  fast, irregular heartbeat  feeling faint or lightheaded  increased blood pressure  irritable  restlessness  tremors  seizures  signs and symptoms of a stroke like changes in vision; confusion; trouble speaking or understanding; severe headaches; sudden numbness or weakness of the face, arm or leg; trouble walking; dizziness; loss of balance or coordination  unusually weak or tired Side effects that usually do not require medical attention (report to your doctor or health care professional if they continue or are bothersome):  changes in taste  constipation or diarrhea  dizziness  dry mouth  headache  trouble sleeping  upset stomach This list may not describe all possible side effects. Call your doctor for medical advice about side effects. You may report side effects to FDA at 1-800-FDA-1088. Where should I keep my medicine? Keep out of the reach of children. This medicine can be abused. Keep your medicine in a safe place to protect it from theft. Do not share this medicine with anyone. Selling or giving away this medicine is dangerous and against the law. This medicine may cause harm and death if it is taken by other  adults, children, or pets. Return medicine that has not been used to an official disposal site. Contact the DEA at 8287827976 or your city/county government to find a site. If you cannot return the medicine, mix any unused medicine with a substance like cat litter or coffee grounds. Then throw the medicine away in a sealed container like a sealed bag or coffee can with a lid. Do not use the medicine after the expiration date. Store at room temperature between 20 and 25 degrees C (68 and 77 degrees F). Keep container tightly closed. NOTE: This sheet is a summary. It may not cover all possible information. If you have questions about this medicine, talk to your doctor, pharmacist, or health care provider.  2020 Elsevier/Gold Standard (2019-07-10 12:54:20)

## 2020-06-20 NOTE — Progress Notes (Signed)
HPI:      Julia Campbell is a 44 y.o. [id number] who LMP was Patient's last menstrual period was 06/01/2020., she presents today for her annual examination. The patient has no complaints today other than weight gain about 10 lbs/yr for the last 2-3 years despite exercise (occas) and diet. The patient is sexually active. Her last pap: approximate date 2020 and was normal and last mammogram: approximate date 2020 and was normal. The patient does perform self breast exams.  There is no notable family history of breast or ovarian cancer in her family.  The patient has regular exercise: yes.  The patient denies current symptoms of depression.    GYN History: Contraception: tubal ligation   Cervical Hx: 2014 LGSIL, Colpo Bx CIN I 2016 and 2017 ASCUS 2018 LGSIL 2018 Colpo Bx CIN I Cryo 11/2017 2019 Normal PAP 2020 Normal PAP  PMHx: Past Medical History:  Diagnosis Date   Anxiety    Cervical dysplasia    CIN1 on bx 2014   Cervical high risk human papillomavirus (HPV) DNA test positive since 2013   Constipation    Depression    Hyperlipidemia    Hypertension    LGSIL on Pap smear of cervix 03/2017   Obesity    Vaginal high risk HPV DNA test positive 03/2017   Past Surgical History:  Procedure Laterality Date   COLPOSCOPY     TUBAL LIGATION     Family History  Problem Relation Age of Onset   Kidney disease Mother        Stage 5   Hypertension Mother    Obesity Mother    Stroke Maternal Grandmother    Heart disease Maternal Grandmother    Hypertension Father    Throat cancer Father        late 58s   Heart attack Maternal Grandfather    Cancer Neg Hx    Diabetes Neg Hx    Social History   Tobacco Use   Smoking status: Never Smoker   Smokeless tobacco: Never Used  Vaping Use   Vaping Use: Never used  Substance Use Topics   Alcohol use: No    Alcohol/week: 0.0 standard drinks   Drug use: No    Current Outpatient Medications:     buPROPion (WELLBUTRIN XL) 300 MG 24 hr tablet, Take 1 tablet (300 mg total) by mouth daily., Disp: 90 tablet, Rfl: 2   chlorthalidone (HYGROTON) 25 MG tablet, Take 1 tablet (25 mg total) by mouth daily., Disp: 90 tablet, Rfl: 3   gemfibrozil (LOPID) 600 MG tablet, TAKE 1 TABLET BY MOUTH TWICE A DAY BEFORE MEALS, Disp: 180 tablet, Rfl: 3   Vitamin D, Ergocalciferol, (DRISDOL) 1.25 MG (50000 UNIT) CAPS capsule, Take 1 capsule (50,000 Units total) by mouth every 7 (seven) days., Disp: 8 capsule, Rfl: 4   phentermine (ADIPEX-P) 37.5 MG tablet, One tablet po in morning., Disp: 30 tablet, Rfl: 0 Allergies: Patient has no known allergies.  Review of Systems  Constitutional: Negative for chills, fever and malaise/fatigue.  HENT: Negative for congestion, sinus pain and sore throat.   Eyes: Negative for blurred vision and pain.  Respiratory: Negative for cough and wheezing.   Cardiovascular: Negative for chest pain and leg swelling.  Gastrointestinal: Negative for abdominal pain, constipation, diarrhea, heartburn, nausea and vomiting.  Genitourinary: Negative for dysuria, frequency, hematuria and urgency.  Musculoskeletal: Negative for back pain, joint pain, myalgias and neck pain.  Skin: Negative for itching and rash.  Neurological: Negative for  dizziness, tremors and weakness.  Endo/Heme/Allergies: Does not bruise/bleed easily.  Psychiatric/Behavioral: Negative for depression. The patient is not nervous/anxious and does not have insomnia.     Objective: BP (!) 142/80    Ht 5' 2.5" (1.588 m)    Wt 171 lb (77.6 kg)    LMP 06/01/2020    BMI 30.78 kg/m   Filed Weights   06/20/20 0826  Weight: 171 lb (77.6 kg)   Body mass index is 30.78 kg/m. Physical Exam Constitutional:      General: She is not in acute distress.    Appearance: She is well-developed.  Genitourinary:     Pelvic exam was performed with patient supine.     Urethra, bladder, vagina and rectum normal.     No lesions in the  vagina.     No vaginal bleeding.     No cervical motion tenderness, friability, lesion or polyp.     Uterus is enlarged and mobile.     No uterine mass detected.    Uterus is midaxial and globular.     No right or left adnexal mass present.     Right adnexa not tender.     Left adnexa not tender.     Genitourinary Comments: 10 weeks size  HENT:     Head: Normocephalic and atraumatic. No laceration.     Right Ear: Hearing normal.     Left Ear: Hearing normal.     Mouth/Throat:     Pharynx: Uvula midline.  Eyes:     Pupils: Pupils are equal, round, and reactive to light.  Neck:     Thyroid: No thyromegaly.  Cardiovascular:     Rate and Rhythm: Normal rate and regular rhythm.     Heart sounds: No murmur heard.  No friction rub. No gallop.   Pulmonary:     Effort: Pulmonary effort is normal. No respiratory distress.     Breath sounds: Normal breath sounds. No wheezing.  Chest:     Breasts:        Right: No mass, skin change or tenderness.        Left: No mass, skin change or tenderness.  Abdominal:     General: Bowel sounds are normal. There is no distension.     Palpations: Abdomen is soft.     Tenderness: There is no abdominal tenderness. There is no rebound.  Musculoskeletal:        General: Normal range of motion.     Cervical back: Normal range of motion and neck supple.  Neurological:     Mental Status: She is alert and oriented to person, place, and time.     Cranial Nerves: No cranial nerve deficit.  Skin:    General: Skin is warm and dry.  Psychiatric:        Judgment: Judgment normal.  Vitals reviewed.     Assessment:  ANNUAL EXAM 1. Women's annual routine gynecological examination   2. CIN I (cervical intraepithelial neoplasia I)   3. Encounter for screening mammogram for malignant neoplasm of breast   4. Intramural leiomyoma of uterus   5. Obesity (BMI 30-39.9)      Screening Plan:            1.  Cervical Screening-  Pap smear done today and yearly  due to past abnormal CIN I, s/p cryo tx  2. Breast screening- Exam annually and mammogram>40 planned (at [facility])  3. Colonoscopy every 10 years, Hemoccult testing - after age 97  4. Labs w PCP  5. Counseling for contraception: bilateral tubal ligation   6. Intramural leiomyoma of uterus Cont to monitor for sx's and size on exam   7. Obesity (BMI 30-39.9) Desires additional help w weight loss Diet counseled, exercise advised, meds discussed - phentermine (ADIPEX-P) 37.5 MG tablet; One tablet po in morning.  Dispense: 30 tablet; Refill: 0      F/U  Return in about 4 weeks (around 07/18/2020) for Follow up.  Barnett Applebaum, MD, [facility], [facility] 06/20/2020  9:01 AM

## 2020-06-21 LAB — CYTOLOGY - PAP: Diagnosis: NEGATIVE

## 2020-06-28 ENCOUNTER — Encounter: Payer: Self-pay | Admitting: Obstetrics & Gynecology

## 2020-07-18 ENCOUNTER — Ambulatory Visit: Payer: 59 | Admitting: Obstetrics & Gynecology

## 2020-07-26 NOTE — Telephone Encounter (Signed)
Sch Virtual appt

## 2020-07-27 NOTE — Telephone Encounter (Signed)
Patient is scheduled 08/29/20 with [facility]

## 2020-07-29 ENCOUNTER — Other Ambulatory Visit: Payer: Self-pay | Admitting: Nurse Practitioner

## 2020-07-31 ENCOUNTER — Other Ambulatory Visit: Payer: Self-pay | Admitting: Nurse Practitioner

## 2020-08-29 ENCOUNTER — Encounter: Payer: Self-pay | Admitting: Obstetrics & Gynecology

## 2020-08-29 ENCOUNTER — Ambulatory Visit (INDEPENDENT_AMBULATORY_CARE_PROVIDER_SITE_OTHER): Payer: 59 | Admitting: Obstetrics & Gynecology

## 2020-08-29 ENCOUNTER — Other Ambulatory Visit: Payer: Self-pay

## 2020-08-29 VITALS — Ht 62.5 in | Wt 170.0 lb

## 2020-08-29 DIAGNOSIS — E669 Obesity, unspecified: Secondary | ICD-10-CM | POA: Diagnosis not present

## 2020-08-29 MED ORDER — PHENTERMINE HCL 37.5 MG PO TABS: ORAL_TABLET | ORAL | 1 refills | Status: DC

## 2020-08-29 MED ORDER — TOPIRAMATE 25 MG PO TABS: 25.0000 mg | ORAL_TABLET | Freq: Every day | ORAL | 7 refills | Status: DC

## 2020-08-29 NOTE — Progress Notes (Signed)
Virtual Visit via Telephone Note  I connected with Julia Campbell on 08/29/20 at 10:40 AM EST by telephone and verified that I am speaking with the correct person using two identifiers.  Location: Patient: [facility]17me Provider: Office   I discussed the limitations, risks, security and privacy concerns of performing an evaluation and management service by telephone and the availability of in person appointments. I also discussed with the patient that there may be a patient responsible charge related to this service. The patient expressed understanding and agreed to proceed.  History of Present Illness: Julia Campbell is a 44 y.o. who was started on Phentermine approximately 2 months ago due to obesity/abnormal weight gain. The patient has lost 1 pounds over the past month that she used it (then stopped, no refills) due to meds..   She has these side effects: none.  PMHx: She  has a past medical history of Anxiety, Cervical dysplasia, Cervical high risk human papillomavirus (HPV) DNA test positive (since 2013), Constipation, Depression, Hyperlipidemia, Hypertension, LGSIL on Pap smear of cervix (03/2017), Obesity, and Vaginal high risk HPV DNA test positive (03/2017). Also,  has a past surgical history that includes Tubal ligation and Colposcopy., family history includes Heart attack in her maternal grandfather; Heart disease in her maternal grandmother; Hypertension in her father and mother; Kidney disease in her mother; Obesity in her mother; Stroke in her maternal grandmother; Throat cancer in her father.,  reports that she has never smoked. She has never used smokeless tobacco. She reports that she does not drink alcohol and does not use drugs.  She has a current medication list which includes the following prescription(s): chlorthalidone, gemfibrozil, phentermine, and topiramate. Also, has No Known Allergies.  Review of Systems  All other systems reviewed and are negative.     Observations/Objective: No exam today, due to telephone eVisit due to [facility] virus restriction on elective visits and procedures.  Prior visits reviewed along with ultrasounds/labs as indicated.  Assessment and Plan:   ICD-10-CM   1. Obesity (BMI 30-39.9)  E66.9 phentermine (ADIPEX-P) 37.5 MG tablet    topiramate (TOPAMAX) 25 MG tablet   Assessment: obesity Medication treatment is going adequately for her.  Plan: Patient is continued/added to prescription appetite suppressants: Phentermine and Topamax once daily.   Will continue to assist patient in incorporating positive experiences into her life to promote a positive mental attitude.  Education given regarding appropriate lifestyle changes for weight loss, including regular physical activity, healthy coping strategies, caloric restriction, and healthy eating patterns.  The risks and benefits as well as side effects of medication, such as Phenteramine or Tenuate, is discussed.  The pros and cons of suppressing appetite and boosting metabolism is counseled.  Risks of tolerance and addiction discussed.  Use of medicine will be short term.  Pt to call with any negative side effects and agrees to keep follow up appointments.   Follow Up Instructions: 2 mos   I discussed the assessment and treatment plan with the patient. The patient was provided an opportunity to ask questions and all were answered. The patient agreed with the plan and demonstrated an understanding of the instructions.   The patient was advised to call back or seek an in-person evaluation if the symptoms worsen or if the condition fails to improve as anticipated.  I provided 15 minutes of non-face-to-face time during this encounter.   Hoyt Koch, MD

## 2020-09-15 ENCOUNTER — Other Ambulatory Visit: Payer: Self-pay | Admitting: Obstetrics & Gynecology

## 2020-09-15 DIAGNOSIS — Z1231 Encounter for screening mammogram for malignant neoplasm of breast: Secondary | ICD-10-CM

## 2020-10-13 ENCOUNTER — Other Ambulatory Visit: Payer: Self-pay

## 2020-10-13 ENCOUNTER — Ambulatory Visit (INDEPENDENT_AMBULATORY_CARE_PROVIDER_SITE_OTHER): Payer: 59 | Admitting: Obstetrics & Gynecology

## 2020-10-13 ENCOUNTER — Encounter: Payer: Self-pay | Admitting: Obstetrics & Gynecology

## 2020-10-13 VITALS — BP 100/70 | Ht 62.5 in | Wt 163.0 lb

## 2020-10-13 DIAGNOSIS — R300 Dysuria: Secondary | ICD-10-CM | POA: Diagnosis not present

## 2020-10-13 DIAGNOSIS — R102 Pelvic and perineal pain unspecified side: Secondary | ICD-10-CM

## 2020-10-13 MED ORDER — SULFAMETHOXAZOLE-TRIMETHOPRIM 800-160 MG PO TABS: 1.0000 | ORAL_TABLET | Freq: Two times a day (BID) | ORAL | 0 refills | Status: AC

## 2020-10-13 NOTE — Progress Notes (Signed)
  HPI:      [name] is a 45 y.o. [id number] who LMP was Patient's last menstrual period was 10/12/2020., presents today for a problem visit.    Urinary Tract Infection: Patient complains of dysuria . She has had symptoms for 1 day. Patient also complains of urinary freq, urgency, and incomplete bladder emptying; and mild back pain. Patient denies fever and vaginal discharge. Patient does have a history of recurrent UTI.  She reports 3 UTI over the last 12 mos.  Last UTI dx was 3 mos ago.  Patient does not have a history of pyelonephritis.   PMHx: She  has a past medical history of Anxiety, Cervical dysplasia, Cervical high risk human papillomavirus (HPV) DNA test positive (since 2013), Constipation, Depression, Hyperlipidemia, Hypertension, LGSIL on Pap smear of cervix (03/2017), Obesity, and Vaginal high risk HPV DNA test positive (03/2017). Also,  has a past surgical history that includes Tubal ligation and Colposcopy., family history includes Heart attack in her maternal grandfather; Heart disease in her maternal grandmother; Hypertension in her father and mother; Kidney disease in her mother; Obesity in her mother; Stroke in her maternal grandmother; Throat cancer in her father.,  reports that she has never smoked. She has never used smokeless tobacco. She reports that she does not drink alcohol and does not use drugs.  She has a current medication list which includes the following prescription(s): chlorthalidone, gemfibrozil, phentermine, sulfamethoxazole-trimethoprim, and topiramate. Also, has No Known Allergies.  Review of Systems  Constitutional: Negative for chills, fever and malaise/fatigue.  HENT: Negative for congestion, sinus pain and sore throat.   Eyes: Negative for blurred vision and pain.  Respiratory: Negative for cough and wheezing.   Cardiovascular: Negative for chest pain and leg swelling.  Gastrointestinal: Negative for abdominal pain, constipation, diarrhea, heartburn,  nausea and vomiting.  Genitourinary: Positive for frequency and urgency. Negative for dysuria and hematuria.  Musculoskeletal: Negative for back pain, joint pain, myalgias and neck pain.  Skin: Negative for itching and rash.  Neurological: Negative for dizziness, tremors and weakness.  Endo/Heme/Allergies: Does not bruise/bleed easily.  Psychiatric/Behavioral: Negative for depression. The patient is not nervous/anxious and does not have insomnia.     Objective: BP 100/70   Ht 5' 2.5" (1.588 m)   Wt 163 lb (73.9 kg)   LMP 10/12/2020   BMI 29.34 kg/m  Physical Exam Constitutional:      General: She is not in acute distress.    Appearance: She is well-developed and well-nourished.  Musculoskeletal:        General: Normal range of motion.  Neurological:     Mental Status: She is alert and oriented to person, place, and time.  Skin:    General: Skin is warm and dry.  Psychiatric:        Mood and Affect: Mood and affect normal.  Vitals reviewed.   Back- No CVAT  UA- contam by AZO use  ASSESSMENT/PLAN:   Acute cystitis Plan Culture, and tx w Bactrim Recurrent UTI not yet established; options for prevention discussed   Barnett Applebaum, MD, [facility], [facility] 10/13/2020  4:27 PM

## 2020-10-13 NOTE — Telephone Encounter (Signed)
Please schedule

## 2020-10-15 LAB — URINE CULTURE

## 2020-10-28 ENCOUNTER — Other Ambulatory Visit: Payer: Self-pay | Admitting: Obstetrics & Gynecology

## 2020-10-28 DIAGNOSIS — E669 Obesity, unspecified: Secondary | ICD-10-CM

## 2020-10-28 MED ORDER — PHENTERMINE HCL 37.5 MG PO TABS: ORAL_TABLET | ORAL | 0 refills | Status: DC

## 2020-10-28 NOTE — Telephone Encounter (Signed)
Called and left voicemail for patient to call back to be scheduled. 

## 2020-10-28 NOTE — Telephone Encounter (Signed)
Sch appt for one month from now.  Rx refill one month done.

## 2020-10-28 NOTE — Telephone Encounter (Signed)
Does she need a different appointment for her refill?

## 2020-11-01 ENCOUNTER — Other Ambulatory Visit: Payer: Self-pay

## 2020-11-01 ENCOUNTER — Ambulatory Visit (INDEPENDENT_AMBULATORY_CARE_PROVIDER_SITE_OTHER): Payer: 59 | Admitting: Nurse Practitioner

## 2020-11-01 ENCOUNTER — Encounter: Payer: Self-pay | Admitting: Nurse Practitioner

## 2020-11-01 VITALS — BP 117/83 | HR 87 | Temp 98.4°F | Wt 159.6 lb

## 2020-11-01 DIAGNOSIS — E782 Mixed hyperlipidemia: Secondary | ICD-10-CM

## 2020-11-01 DIAGNOSIS — I10 Essential (primary) hypertension: Secondary | ICD-10-CM | POA: Diagnosis not present

## 2020-11-01 DIAGNOSIS — E663 Overweight: Secondary | ICD-10-CM

## 2020-11-01 DIAGNOSIS — Z114 Encounter for screening for human immunodeficiency virus [HIV]: Secondary | ICD-10-CM

## 2020-11-01 DIAGNOSIS — F325 Major depressive disorder, single episode, in full remission: Secondary | ICD-10-CM

## 2020-11-01 DIAGNOSIS — E559 Vitamin D deficiency, unspecified: Secondary | ICD-10-CM | POA: Diagnosis not present

## 2020-11-01 DIAGNOSIS — Z1159 Encounter for screening for other viral diseases: Secondary | ICD-10-CM

## 2020-11-01 MED ORDER — BUPROPION HCL ER (XL) 300 MG PO TB24: 300.0000 mg | ORAL_TABLET | Freq: Every day | ORAL | 4 refills | Status: DC

## 2020-11-01 NOTE — Assessment & Plan Note (Signed)
BMI 28.73.  Followed by GYN with weight loss medication.  Recommended eating smaller high protein, low fat meals more frequently and exercising 30 mins a day 5 times a week with a goal of 10-15lb weight loss in the next 3 months. Patient voiced their understanding and motivation to adhere to these recommendations.

## 2020-11-01 NOTE — Assessment & Plan Note (Addendum)
Chronic, stable with BP at goal.  Continue current medication regimen and adjust as needed.  BP at goal today in office.  Check BMP and TSH today.  Recommend occasional BP checks at home and documenting these for provider.  DASH diet focus.  Return in 6 months.

## 2020-11-01 NOTE — Assessment & Plan Note (Signed)
Chronic, ongoing would like to restart medication, has been out of this x 2 months.  Denies SI/HI.  Restart Wellbutrin and adjust regimen as needed.  PHQ9 - 3, slight increase without medication.  Return in 6 months.

## 2020-11-01 NOTE — Progress Notes (Signed)
BP 117/83   Pulse 87   Temp 98.4 F (36.9 C) (Oral)   Wt 159 lb 9.6 oz (72.4 kg)   LMP 10/12/2020   SpO2 98%   BMI 28.73 kg/m    Subjective:    Patient ID: Julia Campbell, female    DOB: June 19, 1976, 45 y.o.   MRN: [id number]  HPI: Julia Campbell is a 45 y.o. female  Chief Complaint  Patient presents with  . Hyperlipidemia  . Hypertension    Pt states she is doing well with blood pressure and pt would like to discuss prescription refill today   HYPERTENSION / HYPERLIPIDEMIA Currently on Hygroton and Lopid (only takes once a day).She started on Phentermine & Topamax with OB/GYN in mid-December.  Vitamin D deficiency noted on labs -- 35.7, not currently taking supplement. Satisfied with current treatment? yes Duration of hypertension: chronic BP monitoring frequency: not checking BP range:  BP medication side effects: no Duration of hyperlipidemia: chronic Cholesterol medication side effects: no Cholesterol supplements: none Medication compliance: good compliance Aspirin: no Recent stressors: no Recurrent headaches: no Visual changes: no Palpitations: no Dyspnea: no Chest pain: no Lower extremity edema: no Dizzy/lightheaded: no  The 10-year ASCVD risk score Mikey Bussing DC Jr., et al., 2013) is: 1.3%   Values used to calculate the score:     Age: 37 years     Sex: Female     Is Non-Hispanic African American: No     Diabetic: No     Tobacco smoker: No     Systolic Blood Pressure: 829 mmHg     Is BP treated: Yes     HDL Cholesterol: 55 mg/dL     Total Cholesterol: 224 mg/dL  DEPRESSION Has been out of Wellbutrin for 2 months, feels like she would like to return to this. Mood status:stable Satisfied with current treatment?:no Symptom severity:mild Duration of current treatment :chronic Side effects:no Medication compliance:good compliance Psychotherapy/counseling:none Previous psychiatric medications:Wellbutrin Anxious  mood:no Anhedonia:no Significant weight loss or gain:no Insomnia:none Fatigue:no Feelings of worthlessness or guilt:sometimes Impaired concentration/indecisiveness:no Suicidal ideations:no Hopelessness:occasional Crying spells:sometimes Depression screen [facility] 2/9 11/01/2020 04/29/2020 11/16/2019 10/28/2019 10/28/2019  Decreased Interest 1 0 2 0 0  Down, Depressed, Hopeless 1 0 3 1 1   PHQ - 2 Score 2 0 5 1 1   Altered sleeping 0 0 0 0 0  Tired, decreased energy 0 0 2 0 0  Change in appetite 0 0 2 1 1   Feeling bad or failure about yourself  1 0 3 1 1   Trouble concentrating 0 0 0 0 0  Moving slowly or fidgety/restless 0 0 0 0 0  Suicidal thoughts 0 0 1 0 0  PHQ-9 Score 3 0 13 3 3   Difficult doing work/chores - Not difficult at all Not difficult at all - -  Some recent data might be hidden    Relevant past medical, surgical, family and social history reviewed and updated as indicated. Interim medical history since our last visit reviewed. Allergies and medications reviewed and updated.  Review of Systems  Constitutional: Negative for activity change, appetite change, diaphoresis, fatigue and fever.  Respiratory: Negative for cough, chest tightness and shortness of breath.   Cardiovascular: Negative for chest pain, palpitations and leg swelling.  Gastrointestinal: Negative.   Endocrine: Negative.   Neurological: Negative.   Psychiatric/Behavioral: Negative.     Per HPI unless specifically indicated above     Objective:    BP 117/83   Pulse 87   Temp 98.4 F (36.9  C) (Oral)   Wt 159 lb 9.6 oz (72.4 kg)   LMP 10/12/2020   SpO2 98%   BMI 28.73 kg/m   Wt Readings from Last 3 Encounters:  11/01/20 159 lb 9.6 oz (72.4 kg)  10/13/20 163 lb (73.9 kg)  08/29/20 170 lb (77.1 kg)    Physical Exam Vitals and nursing note reviewed.  Constitutional:      General: She is awake. She is not in acute distress.    Appearance: She is well-developed, well-groomed and overweight.  She is not ill-appearing.  HENT:     Head: Normocephalic.     Right Ear: Hearing normal.     Left Ear: Hearing normal.  Eyes:     General: Lids are normal.        Right eye: No discharge.        Left eye: No discharge.     Conjunctiva/sclera: Conjunctivae normal.     Pupils: Pupils are equal, round, and reactive to light.  Neck:     Thyroid: No thyromegaly.     Vascular: No carotid bruit.  Cardiovascular:     Rate and Rhythm: Normal rate and regular rhythm.     Heart sounds: Normal heart sounds. No murmur heard. No gallop.   Pulmonary:     Effort: Pulmonary effort is normal. No accessory muscle usage or respiratory distress.     Breath sounds: Normal breath sounds.  Abdominal:     General: Bowel sounds are normal.     Palpations: Abdomen is soft.  Musculoskeletal:     Cervical back: Normal range of motion and neck supple.     Right lower leg: No edema.     Left lower leg: No edema.  Skin:    General: Skin is warm and dry.  Neurological:     Mental Status: She is alert and oriented to person, place, and time.  Psychiatric:        Attention and Perception: Attention normal.        Mood and Affect: Mood normal.        Behavior: Behavior normal. Behavior is cooperative.        Thought Content: Thought content normal.        Judgment: Judgment normal.    Results for orders placed or performed in visit on 10/13/20  Urine Culture   Specimen: Urine, Clean Catch   UC  Result Value Ref Range   Urine Culture, Routine Final report (A)    Organism ID, Bacteria Escherichia coli (A)    Antimicrobial Susceptibility Comment       Assessment & Plan:   Problem List Items Addressed This Visit      Cardiovascular and Mediastinum   Essential hypertension    Chronic, stable with BP at goal.  Continue current medication regimen and adjust as needed.  BP at goal today in office.  Check BMP and TSH today.  Recommend occasional BP checks at home and documenting these for provider.  DASH  diet focus.  Return in 6 months.      Relevant Orders   Basic metabolic panel   TSH     Other   Hyperlipidemia    Chronic, ongoing.  Continue current medication regimen and adjust as needed.  Lipid panel today.  Return in 6 months.      Relevant Orders   Lipid Panel w/o Chol/HDL Ratio   Overweight    BMI 28.73.  Followed by GYN with weight loss medication.  Recommended eating smaller  high protein, low fat meals more frequently and exercising 30 mins a day 5 times a week with a goal of 10-15lb weight loss in the next 3 months. Patient voiced their understanding and motivation to adhere to these recommendations.       Depression, major, single episode, complete remission ([place]) - Primary    Chronic, ongoing would like to restart medication, has been out of this x 2 months.  Denies SI/HI.  Restart Wellbutrin and adjust regimen as needed.  PHQ9 - 3, slight increase without medication.  Return in 6 months.      Relevant Medications   buPROPion (WELLBUTRIN XL) 300 MG 24 hr tablet   Vitamin D deficiency    Ongoing, will recheck Vit D level today.  Recommend to restart supplement daily.      Relevant Orders   VITAMIN D 25 Hydroxy (Vit-D Deficiency, Fractures)    Other Visit Diagnoses    Encounter for screening for HIV       HIV on labs today   Relevant Orders   HIV Antibody (routine testing w rflx)   Need for hepatitis C screening test       Hep C on labs today   Relevant Orders   Hepatitis C antibody       Follow up plan: Return in about 6 months (around 05/01/2021) for HLD, MOOD, HTN.

## 2020-11-01 NOTE — Patient Instructions (Signed)

## 2020-11-01 NOTE — Assessment & Plan Note (Signed)
Chronic, ongoing.  Continue current medication regimen and adjust as needed.  Lipid panel today.  Return in 6 months. 

## 2020-11-01 NOTE — Assessment & Plan Note (Signed)
Ongoing, will recheck Vit D level today.  Recommend to restart supplement daily.

## 2020-11-02 ENCOUNTER — Other Ambulatory Visit: Payer: Self-pay | Admitting: Nurse Practitioner

## 2020-11-02 DIAGNOSIS — E876 Hypokalemia: Secondary | ICD-10-CM

## 2020-11-02 LAB — BASIC METABOLIC PANEL
BUN/Creatinine Ratio: 17 (ref 9–23)
BUN: 13 mg/dL (ref 6–24)
CO2: 23 mmol/L (ref 20–29)
Calcium: 9.8 mg/dL (ref 8.7–10.2)
Chloride: 96 mmol/L (ref 96–106)
Creatinine, Ser: 0.75 mg/dL (ref 0.57–1.00)
GFR calc Af Amer: 110 mL/min/{1.73_m2} (ref >59)
GFR calc non Af Amer: 95 mL/min/{1.73_m2} (ref >59)
Glucose: 90 mg/dL (ref 65–99)
Potassium: 3.2 mmol/L — ABNORMAL LOW (ref 3.5–5.2)
Sodium: 138 mmol/L (ref 134–144)

## 2020-11-02 LAB — VITAMIN D 25 HYDROXY (VIT D DEFICIENCY, FRACTURES): Vit D, 25-Hydroxy: 23.9 ng/mL — ABNORMAL LOW (ref 30.0–100.0)

## 2020-11-02 LAB — LIPID PANEL W/O CHOL/HDL RATIO
Cholesterol, Total: 246 mg/dL — ABNORMAL HIGH (ref 100–199)
HDL: 56 mg/dL (ref >39)
LDL Chol Calc (NIH): 161 mg/dL — ABNORMAL HIGH (ref 0–99)
Triglycerides: 160 mg/dL — ABNORMAL HIGH (ref 0–149)
VLDL Cholesterol Cal: 29 mg/dL (ref 5–40)

## 2020-11-02 LAB — HEPATITIS C ANTIBODY: Hep C Virus Ab: <0.1 s/co ratio (ref 0.0–0.9)

## 2020-11-02 LAB — HIV ANTIBODY (ROUTINE TESTING W REFLEX): HIV Screen 4th Generation wRfx: NONREACTIVE

## 2020-11-02 LAB — TSH: TSH: 1.66 u[IU]/mL (ref 0.450–4.500)

## 2020-11-02 NOTE — Progress Notes (Signed)
Contacted via [facility] afternoon Omaya, your labs have returned.   - Hep C and HIV are negative - Thyroid is normal - Cholesterol levels continue to show elevation, continue Lopid - Vitamin D level is on lower side, recommend Vitamin D3 2000 units daily which is good for overall health. - Kidney function is normal, but potassium level is low at 3.2.  I recommend increasing potassium rich foods in diet like bananas, dried fruit, sweet or white potatoes, spinach, mangoes.  I would like to recheck this in 2 weeks to ensure not continuing to be low, or we may need to send in supplement.  Please schedule lab visit only.  Any questions? Keep being awesome!!  Thank you for allowing me to participate in your care. Kindest regards, Rochanda Harpham

## 2020-11-03 ENCOUNTER — Encounter: Payer: Self-pay | Admitting: Obstetrics & Gynecology

## 2020-11-03 ENCOUNTER — Other Ambulatory Visit: Payer: Self-pay

## 2020-11-03 ENCOUNTER — Ambulatory Visit (INDEPENDENT_AMBULATORY_CARE_PROVIDER_SITE_OTHER): Payer: 59 | Admitting: Obstetrics & Gynecology

## 2020-11-03 VITALS — BP 120/80 | Ht 62.5 in | Wt 159.0 lb

## 2020-11-03 DIAGNOSIS — E663 Overweight: Secondary | ICD-10-CM | POA: Diagnosis not present

## 2020-11-03 DIAGNOSIS — E669 Obesity, unspecified: Secondary | ICD-10-CM | POA: Diagnosis not present

## 2020-11-03 DIAGNOSIS — R635 Abnormal weight gain: Secondary | ICD-10-CM | POA: Diagnosis not present

## 2020-11-03 MED ORDER — PHENTERMINE HCL 37.5 MG PO TABS: ORAL_TABLET | ORAL | 0 refills | Status: DC

## 2020-11-03 NOTE — Progress Notes (Signed)
  History of Present Illness:  Julia Campbell is a 45 y.o. who was started on Phentermine plus Topamax approximately 2 months ago due to obesity/abnormal weight gain. The patient has lost 12 pounds over the past 2 mos due to meds..   She has these side effects: none.  Goal weight 140  PMHx: She  has a past medical history of Anxiety, Cervical dysplasia, Cervical high risk human papillomavirus (HPV) DNA test positive (since 2013), Constipation, Depression, Hyperlipidemia, Hypertension, LGSIL on Pap smear of cervix (03/2017), Obesity, and Vaginal high risk HPV DNA test positive (03/2017). Also,  has a past surgical history that includes Tubal ligation and Colposcopy., family history includes Heart attack in her maternal grandfather; Heart disease in her maternal grandmother; Hypertension in her father and mother; Kidney disease in her mother; Obesity in her mother; Stroke in her maternal grandmother; Throat cancer in her father.,  reports that she has never smoked. She has never used smokeless tobacco. She reports that she does not drink alcohol and does not use drugs.  She has a current medication list which includes the following prescription(s): bupropion, chlorthalidone, gemfibrozil, topiramate, and phentermine. Also, has No Known Allergies.  Review of Systems  All other systems reviewed and are negative.   Physical Exam:  BP 120/80   Ht 5' 2.5" (1.588 m)   Wt 159 lb (72.1 kg)   LMP 10/12/2020   BMI 28.62 kg/m  Body mass index is 28.62 kg/m. Filed Weights   11/03/20 1449  Weight: 159 lb (72.1 kg)    Physical Exam Constitutional:      General: She is not in acute distress.    Appearance: She is well-developed and well-nourished.  Musculoskeletal:        General: Normal range of motion.  Neurological:     Mental Status: She is alert and oriented to person, place, and time.  Skin:    General: Skin is warm and dry.  Psychiatric:        Mood and Affect: Mood and affect normal.  Vitals  reviewed.     Assessment: overweight Medication treatment is going well for her.  Plan: Patient is continued/added to prescription appetite suppressants: Phentermine plus Topamax.   Will continue to assist patient in incorporating positive experiences into her life to promote a positive mental attitude.  Education given regarding appropriate lifestyle changes for weight loss, including regular physical activity, healthy coping strategies, caloric restriction, and healthy eating patterns.  The risks and benefits as well as side effects of medication, such as Phenteramine or Tenuate, is discussed.  The pros and cons of suppressing appetite and boosting metabolism is counseled.  Risks of tolerance and addiction discussed.  Use of medicine will be short term.  Pt to call with any negative side effects and agrees to keep follow up appointments.  Patient doing well with weight loss in progress. Will stop medicine  After 2 more mos and allow for a drug-free holiday. Patient understands she may need future therapy if weight gain resumes or she does not reach her weight loss goals on her own with good diet and exercise habits.   A total of 20 minutes were spent face-to-face with the patient as well as preparation, review, communication, and documentation during this encounter.   Barnett Applebaum, MD, [facility], [facility] 11/03/2020  3:04 PM

## 2020-11-04 ENCOUNTER — Ambulatory Visit: Payer: 59 | Admitting: Obstetrics & Gynecology

## 2020-11-09 ENCOUNTER — Other Ambulatory Visit: Payer: Self-pay | Admitting: Nurse Practitioner

## 2020-11-09 NOTE — Telephone Encounter (Signed)
Requested Prescriptions  Pending Prescriptions Disp Refills  . gemfibrozil (LOPID) 600 MG tablet [Pharmacy Med Name: GEMFIBROZIL 600 MG TABLET] 180 tablet 1    Sig: TAKE 1 TABLET BY MOUTH TWICE A DAY BEFORE MEALS     Cardiovascular:  Antilipid - Fibric Acid Derivatives Failed - 11/09/2020  4:39 AM      Failed - Total Cholesterol in normal range and within 360 days    Cholesterol, Total  Date Value Ref Range Status  11/01/2020 246 (H) 100 - 199 mg/dL Final   Cholesterol Piccolo, Waived  Date Value Ref Range Status  04/04/2016 223 (H) <200 mg/dL Final    Comment:                            Desirable                <200                         Borderline High      200- 239                         High                     >239          Failed - LDL in normal range and within 360 days    LDL Chol Calc (NIH)  Date Value Ref Range Status  11/01/2020 161 (H) 0 - 99 mg/dL Final         Failed - Triglycerides in normal range and within 360 days    Triglycerides  Date Value Ref Range Status  11/01/2020 160 (H) 0 - 149 mg/dL Final   Triglycerides Piccolo,Waived  Date Value Ref Range Status  04/04/2016 203 (H) <150 mg/dL Final    Comment:                            Normal                   <150                         Borderline High     150 - 199                         High                200 - 499                         Very High                >499          Failed - ALT in normal range and within 180 days    ALT  Date Value Ref Range Status  11/16/2019 11 0 - 32 IU/L Final         Failed - AST in normal range and within 180 days    AST  Date Value Ref Range Status  11/16/2019 12 0 - 40 IU/L Final         Passed - HDL in normal range and within 360 days    HDL  Date Value Ref Range  Status  11/01/2020 56 >39 mg/dL Final         Passed - Cr in normal range and within 180 days    Creatinine, Ser  Date Value Ref Range Status  11/01/2020 0.75 0.57 - 1.00 mg/dL Final          Passed - eGFR in normal range and within 180 days    GFR calc Af Amer  Date Value Ref Range Status  11/01/2020 110 >59 mL/min/1.73 Final    Comment:    **In accordance with recommendations from the NKF-ASN Task force,**   [facility] is in the process of updating its eGFR calculation to the   2021 CKD-EPI creatinine equation that estimates kidney function   without a race variable.    GFR calc non Af Amer  Date Value Ref Range Status  11/01/2020 95 >59 mL/min/1.73 Final         Passed - Valid encounter within last 12 months    Recent Outpatient Visits          1 week ago Depression, major, single episode, complete remission ([place])   [place], Jolene T, NP   6 months ago Depression, major, single episode, complete remission ([place])   [place], Jolene T, NP   1 year ago Depression, major, single episode, complete remission ([place])   [place], Jolene T, NP   1 year ago Depression, major, single episode, complete remission ([place])   [place], Barbaraann Faster, NP   2 years ago Essential hypertension   [place], Barbaraann Faster, NP      Future Appointments            In 5 months [place], Barbaraann Faster, NP [facility]

## 2020-11-24 ENCOUNTER — Other Ambulatory Visit: Payer: Self-pay | Admitting: Nurse Practitioner

## 2020-11-24 NOTE — Telephone Encounter (Signed)
Requested Prescriptions  Pending Prescriptions Disp Refills  . chlorthalidone (HYGROTON) 25 MG tablet [Pharmacy Med Name: CHLORTHALIDONE 25 MG TABLET] 90 tablet 1    Sig: TAKE 1 TABLET BY MOUTH EVERY DAY     Cardiovascular: Diuretics - Thiazide Failed - 11/24/2020  1:22 AM      Failed - K in normal range and within 360 days    Potassium  Date Value Ref Range Status  11/01/2020 3.2 (L) 3.5 - 5.2 mmol/L Final         Passed - Ca in normal range and within 360 days    Calcium  Date Value Ref Range Status  11/01/2020 9.8 8.7 - 10.2 mg/dL Final         Passed - Cr in normal range and within 360 days    Creatinine, Ser  Date Value Ref Range Status  11/01/2020 0.75 0.57 - 1.00 mg/dL Final         Passed - Na in normal range and within 360 days    Sodium  Date Value Ref Range Status  11/01/2020 138 134 - 144 mmol/L Final         Passed - Last BP in normal range    BP Readings from Last 1 Encounters:  11/03/20 120/80         Passed - Valid encounter within last 6 months    Recent Outpatient Visits          3 weeks ago Depression, major, single episode, complete remission ([place])   [place], Jolene T, NP   6 months ago Depression, major, single episode, complete remission ([place])   [place], Jolene T, NP   1 year ago Depression, major, single episode, complete remission ([place])   [place], Jolene T, NP   1 year ago Depression, major, single episode, complete remission ([place])   [place], [place] T, NP   2 years ago Essential hypertension   [place], Barbaraann Faster, NP      Future Appointments            In 5 months Cannady, Barbaraann Faster, NP [facility]

## 2020-12-30 ENCOUNTER — Other Ambulatory Visit: Payer: Self-pay | Admitting: Nurse Practitioner

## 2020-12-30 ENCOUNTER — Other Ambulatory Visit: Payer: Self-pay | Admitting: Obstetrics & Gynecology

## 2020-12-30 DIAGNOSIS — E669 Obesity, unspecified: Secondary | ICD-10-CM

## 2021-01-12 ENCOUNTER — Other Ambulatory Visit: Payer: Self-pay | Admitting: Nurse Practitioner

## 2021-02-14 ENCOUNTER — Encounter: Payer: Self-pay | Admitting: Nurse Practitioner

## 2021-02-14 ENCOUNTER — Telehealth (INDEPENDENT_AMBULATORY_CARE_PROVIDER_SITE_OTHER): Payer: 59 | Admitting: Nurse Practitioner

## 2021-02-14 ENCOUNTER — Other Ambulatory Visit: Payer: Self-pay

## 2021-02-14 DIAGNOSIS — U071 COVID-19: Secondary | ICD-10-CM

## 2021-02-14 DIAGNOSIS — Z8616 Personal history of COVID-19: Secondary | ICD-10-CM | POA: Insufficient documentation

## 2021-02-14 MED ORDER — MOLNUPIRAVIR EUA 200MG CAPSULE
4.0000 | ORAL_CAPSULE | Freq: Two times a day (BID) | ORAL | 0 refills | Status: AC
  Filled 2021-02-14: qty 40, 5d supply, fill #0

## 2021-02-14 MED ORDER — BENZONATATE 100 MG PO CAPS
100.0000 mg | ORAL_CAPSULE | Freq: Three times a day (TID) | ORAL | 0 refills | Status: DC | PRN
  Filled 2021-02-14: qty 20, 7d supply, fill #0

## 2021-02-14 NOTE — Progress Notes (Signed)
Acute Office Visit  Subjective:    Patient ID: Julia Campbell, female    DOB: [birth year], 45 y.o.   MRN: [id number]  Chief Complaint  Patient presents with  . Covid Positive    With home test yesterday  . fever  . headache  . Cough    HPI Patient is in today for positive covid-19 test yesterday. Symptoms started last Friday, 02/10/21  UPPER RESPIRATORY TRACT INFECTION  Worst symptom: cough Fever: yes Cough: yes Shortness of breath: no Wheezing: no Chest pain: no Chest tightness: no Chest congestion: no Nasal congestion: yes Runny nose: yes Post nasal drip: yes Sneezing: yes Sore throat: yes Swollen glands: no Sinus pressure: yes Headache: yes Face pain: no Toothache: no Ear pain: no  Ear pressure: no  Eyes red/itching:no Eye drainage/crusting: no  Vomiting: no Rash: no Fatigue: yes Sick contacts: no Strep contacts: no  Context: fluctuating Recurrent sinusitis: no Relief with OTC cold/cough medications: yes  Treatments attempted: ibuprofen and cold/sinus    Past Medical History:  Diagnosis Date  . Anxiety   . Cervical dysplasia    CIN1 on bx 2014  . Cervical high risk human papillomavirus (HPV) DNA test positive since 2013  . Constipation   . Depression   . Hyperlipidemia   . Hypertension   . LGSIL on Pap smear of cervix 03/2017  . Obesity   . Vaginal high risk HPV DNA test positive 03/2017    Past Surgical History:  Procedure Laterality Date  . COLPOSCOPY    . TUBAL LIGATION      Family History  Problem Relation Age of Onset  . Kidney disease Mother        Stage 5  . Hypertension Mother   . Obesity Mother   . Stroke Maternal Grandmother   . Heart disease Maternal Grandmother   . Hypertension Father   . Throat cancer Father        late 63s  . Heart attack Maternal Grandfather   . Cancer Neg Hx   . Diabetes Neg Hx     Social History   Socioeconomic History  . Marital status: Married    Spouse name: Not on file  . Number of  children: 2  . Years of education: Not on file  . Highest education level: Not on file  Occupational History  . Occupation: Mudlogger  Tobacco Use  . Smoking status: Never Smoker  . Smokeless tobacco: Never Used  Vaping Use  . Vaping Use: Never used  Substance and Sexual Activity  . Alcohol use: No    Alcohol/week: 0.0 standard drinks  . Drug use: No  . Sexual activity: Yes    Birth control/protection: None, Surgical    Comment: tubal ligation  Other Topics Concern  . Not on file  Social History Narrative  . Not on file   Social Determinants of Health   Financial Resource Strain: Not on file  Food Insecurity: Not on file  Transportation Needs: Not on file  Physical Activity: Not on file  Stress: Not on file  Social Connections: Not on file  Intimate Partner Violence: Not on file    Outpatient Medications Prior to Visit  Medication Sig Dispense Refill  . buPROPion (WELLBUTRIN XL) 300 MG 24 hr tablet TAKE 1 TABLET BY MOUTH EVERY DAY 30 tablet 14  . chlorthalidone (HYGROTON) 25 MG tablet TAKE 1 TABLET BY MOUTH EVERY DAY 90 tablet 1  . gemfibrozil (LOPID) 600 MG tablet TAKE 1 TABLET BY MOUTH  TWICE A DAY BEFORE MEALS 180 tablet 1  . phentermine (ADIPEX-P) 37.5 MG tablet One tablet po in morning. (Patient not taking: Reported on 02/14/2021) 30 tablet 0  . topiramate (TOPAMAX) 25 MG tablet Take 1 tablet (25 mg total) by mouth daily. (Patient not taking: Reported on 02/14/2021) 30 tablet 7   No facility-administered medications prior to visit.    No Known Allergies  Review of Systems  Constitutional: Positive for fatigue and fever.  HENT: Positive for congestion, postnasal drip, sinus pressure and sore throat. Negative for sinus pain.   Eyes: Negative.   Respiratory: Positive for cough. Negative for shortness of breath.   Cardiovascular: Negative.   Gastrointestinal: Negative.   Genitourinary: Negative.   Musculoskeletal: Positive for myalgias.  Skin: Negative.    Neurological: Negative.        Objective:    Physical Exam Vitals and nursing note reviewed.  Constitutional:      General: She is not in acute distress. HENT:     Head: Normocephalic.  Eyes:     Conjunctiva/sclera: Conjunctivae normal.  Pulmonary:     Effort: Pulmonary effort is normal.     Comments: Able to talk in complete sentences Neurological:     Mental Status: She is alert and oriented to person, place, and time.  Psychiatric:        Mood and Affect: Mood normal.        Behavior: Behavior normal.        Thought Content: Thought content normal.        Judgment: Judgment normal.     There were no vitals taken for this visit. Wt Readings from Last 3 Encounters:  11/03/20 159 lb (72.1 kg)  11/01/20 159 lb 9.6 oz (72.4 kg)  10/13/20 163 lb (73.9 kg)    There are no preventive care reminders to display for this patient.  There are no preventive care reminders to display for this patient.   Lab Results  Component Value Date   TSH 1.660 11/01/2020   Lab Results  Component Value Date   WBC 12.0 (H) 11/16/2019   HGB 13.7 11/16/2019   HCT 41.0 11/16/2019   MCV 92 11/16/2019   PLT 466 (H) 11/16/2019   Lab Results  Component Value Date   NA 138 11/01/2020   K 3.2 (L) 11/01/2020   CO2 23 11/01/2020   GLUCOSE 90 11/01/2020   BUN 13 11/01/2020   CREATININE 0.75 11/01/2020   BILITOT 0.3 11/16/2019   ALKPHOS 63 11/16/2019   AST 12 11/16/2019   ALT 11 11/16/2019   PROT 7.3 11/16/2019   ALBUMIN 4.3 11/16/2019   CALCIUM 9.8 11/01/2020   Lab Results  Component Value Date   CHOL 246 (H) 11/01/2020   Lab Results  Component Value Date   HDL 56 11/01/2020   Lab Results  Component Value Date   LDLCALC 161 (H) 11/01/2020   Lab Results  Component Value Date   TRIG 160 (H) 11/01/2020   No results found for: [facility] Lab Results  Component Value Date   HGBA1C 5.5 11/16/2019       Assessment & Plan:   Problem List Items Addressed This Visit       Other   COVID-19 - Primary    Symptoms started 02/10/21, positive home covid-19 test on 02/13/21. Will treat with molnupiravir and prn tessalon. She can continue rest, increasing fluids, and taking ibuprofen/tylenol for her fever. Discussed isolating for 10 days from symptom onset. Note sent through [facility]  to be out of work until February 20, 2021. If she develops worsening symptoms, or trouble breathing, call our office immediately or go to ER.       Relevant Medications   molnupiravir EUA 200 mg CAPS       Meds ordered this encounter  Medications  . molnupiravir EUA 200 mg CAPS    Sig: Take 4 capsules (800 mg total) by mouth 2 (two) times daily for 5 days.    Dispense:  40 capsule    Refill:  0  . benzonatate (TESSALON) 100 MG capsule    Sig: Take 1 capsule (100 mg total) by mouth 3 (three) times daily as needed for cough.    Dispense:  20 capsule    Refill:  0    . This visit was completed via MyChart due to the restrictions of the COVID-19 pandemic. All issues as above were discussed and addressed. Physical exam was done as above through visual confirmation on MyChart. If it was felt that the patient should be evaluated in the office, they were directed there. The patient verbally consented to this visit. . Location of the patient: home . Location of the provider: work . Those involved with this call:  . Provider: Billy Fischer, DNP . CMA: Frazier Butt, CMA . Front Desk/Registration: Jill Side  . Time spent on call: 15 minutes with patient face to face via video conference. More than 50% of this time was spent in counseling and coordination of care. 10 minutes total spent in review of patient's record and preparation of their chart.    Charyl Dancer, NP

## 2021-02-14 NOTE — Assessment & Plan Note (Signed)
Symptoms started 02/10/21, positive home covid-19 test on 02/13/21. Will treat with molnupiravir and prn tessalon. She can continue rest, increasing fluids, and taking ibuprofen/tylenol for her fever. Discussed isolating for 10 days from symptom onset. Note sent through mychart to be out of work until February 20, 2021. If she develops worsening symptoms, or trouble breathing, call our office immediately or go to ER.

## 2021-02-14 NOTE — Patient Instructions (Signed)

## 2021-02-17 ENCOUNTER — Ambulatory Visit: Payer: Self-pay | Admitting: *Deleted

## 2021-02-17 NOTE — Telephone Encounter (Signed)
Noted  

## 2021-02-17 NOTE — Telephone Encounter (Signed)
Summary: covid advise    Pt was diagnosed with covid on Monday / pt had a fever, headache, cough, runny nose and was given medication from [facility] / Pt now has no symptoms and feels better /pt would like to know when it is ok to be around people again/ please advise      Call to patient- patient advised per COVID protocol.  * You can STOP HOME ISOLATION AFTER 5 DAYS if: * ... Fever is gone for at least 24 hours off fever-reducing medicines AND * ... Any cough and other symptoms are improving * Wear a well-fitted MASK FOR 10 FULL DAYS any time you are around others inside your home or in public. Do not go to places where you are unable to wear a mask. * Notes: People who were severely ill with COVID-19 should stay home (isolate) for at least 10 days. Consult your doctor (or NP/PA) before ending isolation.  Reason for Disposition . [1] [id number] diagnosed by positive lab test (e.g., PCR, rapid self-test kit) AND [2] mild symptoms (e.g., cough, fever, others) AND [6] no complications or SOB  Protocols used: CORONAVIRUS (COVID-19) DIAGNOSED OR SUSPECTED-A-AH

## 2021-02-17 NOTE — Telephone Encounter (Signed)
FYI

## 2021-04-29 ENCOUNTER — Encounter: Payer: Self-pay | Admitting: Nurse Practitioner

## 2021-05-02 ENCOUNTER — Other Ambulatory Visit: Payer: Self-pay

## 2021-05-02 ENCOUNTER — Encounter: Payer: Self-pay | Admitting: Nurse Practitioner

## 2021-05-02 ENCOUNTER — Ambulatory Visit (INDEPENDENT_AMBULATORY_CARE_PROVIDER_SITE_OTHER): Payer: 59 | Admitting: Nurse Practitioner

## 2021-05-02 VITALS — BP 110/76 | HR 82 | Temp 98.6°F | Wt 164.4 lb

## 2021-05-02 DIAGNOSIS — F325 Major depressive disorder, single episode, in full remission: Secondary | ICD-10-CM | POA: Diagnosis not present

## 2021-05-02 DIAGNOSIS — E782 Mixed hyperlipidemia: Secondary | ICD-10-CM | POA: Diagnosis not present

## 2021-05-02 DIAGNOSIS — E663 Overweight: Secondary | ICD-10-CM

## 2021-05-02 DIAGNOSIS — I10 Essential (primary) hypertension: Secondary | ICD-10-CM | POA: Diagnosis not present

## 2021-05-02 NOTE — Assessment & Plan Note (Signed)
Chronic, ongoing.  Continue current medication regimen and adjust as needed.  Lipid panel today.  Return in 6 months. 

## 2021-05-02 NOTE — Progress Notes (Signed)
BP 110/76   Pulse 82   Temp 98.6 F (37 C) (Oral)   Wt 164 lb 6.4 oz (74.6 kg)   SpO2 98%   BMI 29.59 kg/m    Subjective:    Patient ID: Julia Campbell, female    DOB: [birth year], 45 y.o.   MRN: [id number]  HPI: Chrisy Jackson is a 45 y.o. female  Chief Complaint  Patient presents with   Hyperlipidemia   Hypertension   Mood   HYPERTENSION / HYPERLIPIDEMIA Currently on Hygroton and Lopid (only takes once a day).  She started on Phentermine & Topamax with OB/GYN in mid-December -- she has been off these several months now.  Last labs her potassium was a little low at 3.2. Satisfied with current treatment? yes Duration of hypertension: chronic BP monitoring frequency: not checking BP range:  BP medication side effects: no Duration of hyperlipidemia: chronic Cholesterol medication side effects: no Cholesterol supplements: none Medication compliance: good compliance Aspirin: no Recent stressors: no Recurrent headaches: no Visual changes: no Palpitations: no Dyspnea: no Chest pain: no Lower extremity edema: no Dizzy/lightheaded: no  The 10-year ASCVD risk score Mikey Bussing DC Jr., et al., 2013) is: 1.3%   Values used to calculate the score:     Age: 83 years     Sex: Female     Is Non-Hispanic African American: No     Diabetic: No     Tobacco smoker: No     Systolic Blood Pressure: A999333 mmHg     Is BP treated: Yes     HDL Cholesterol: 56 mg/dL     Total Cholesterol: 246 mg/dL  DEPRESSION Has not been Wellbutrin for several months. Mood status: stable Satisfied with current treatment?: no Symptom severity: mild  Duration of current treatment : chronic Side effects: no Medication compliance: good compliance Psychotherapy/counseling: none Previous psychiatric medications: Wellbutrin Anxious mood: no Anhedonia: no Significant weight loss or gain: no Insomnia: none Fatigue: no Feelings of worthlessness or guilt: none Impaired concentration/indecisiveness:  no Suicidal ideations: no Hopelessness: none Crying spells: none Depression screen [facility] 2/9 05/02/2021 02/14/2021 11/01/2020 04/29/2020 11/16/2019  Decreased Interest 0 - 1 0 2  Down, Depressed, Hopeless 0 0 1 0 3  PHQ - 2 Score 0 0 2 0 5  Altered sleeping 0 - 0 0 0  Tired, decreased energy 0 - 0 0 2  Change in appetite 0 - 0 0 2  Feeling bad or failure about yourself  0 - 1 0 3  Trouble concentrating 0 - 0 0 0  Moving slowly or fidgety/restless 0 - 0 0 0  Suicidal thoughts 0 - 0 0 1  PHQ-9 Score 0 - 3 0 13  Difficult doing work/chores Not difficult at all - - Not difficult at all Not difficult at all  Some recent data might be hidden    Relevant past medical, surgical, family and social history reviewed and updated as indicated. Interim medical history since our last visit reviewed. Allergies and medications reviewed and updated.  Review of Systems  Constitutional:  Negative for activity change, appetite change, diaphoresis, fatigue and fever.  Respiratory:  Negative for cough, chest tightness and shortness of breath.   Cardiovascular:  Negative for chest pain, palpitations and leg swelling.  Gastrointestinal: Negative.   Endocrine: Negative.   Neurological: Negative.   Psychiatric/Behavioral: Negative.     Per HPI unless specifically indicated above     Objective:    BP 110/76   Pulse 82   Temp 98.6  F (37 C) (Oral)   Wt 164 lb 6.4 oz (74.6 kg)   SpO2 98%   BMI 29.59 kg/m   Wt Readings from Last 3 Encounters:  05/02/21 164 lb 6.4 oz (74.6 kg)  11/03/20 159 lb (72.1 kg)  11/01/20 159 lb 9.6 oz (72.4 kg)    Physical Exam Vitals and nursing note reviewed.  Constitutional:      General: She is awake. She is not in acute distress.    Appearance: She is well-developed, well-groomed and overweight. She is not ill-appearing.  HENT:     Head: Normocephalic.     Right Ear: Hearing normal.     Left Ear: Hearing normal.  Eyes:     General: Lids are normal.        Right  eye: No discharge.        Left eye: No discharge.     Conjunctiva/sclera: Conjunctivae normal.     Pupils: Pupils are equal, round, and reactive to light.  Neck:     Thyroid: No thyromegaly.     Vascular: No carotid bruit.  Cardiovascular:     Rate and Rhythm: Normal rate and regular rhythm.     Heart sounds: Normal heart sounds. No murmur heard.   No gallop.  Pulmonary:     Effort: Pulmonary effort is normal. No accessory muscle usage or respiratory distress.     Breath sounds: Normal breath sounds.  Abdominal:     General: Bowel sounds are normal.     Palpations: Abdomen is soft.  Musculoskeletal:     Cervical back: Normal range of motion and neck supple.     Right lower leg: No edema.     Left lower leg: No edema.  Skin:    General: Skin is warm and dry.  Neurological:     Mental Status: She is alert and oriented to person, place, and time.  Psychiatric:        Attention and Perception: Attention normal.        Mood and Affect: Mood normal.        Behavior: Behavior normal. Behavior is cooperative.        Thought Content: Thought content normal.        Judgment: Judgment normal.   Results for orders placed or performed in visit on [id number]  Basic metabolic panel  Result Value Ref Range   Glucose 90 65 - 99 mg/dL   BUN 13 6 - 24 mg/dL   Creatinine, Ser 0.75 0.57 - 1.00 mg/dL   GFR calc non Af Amer 95 >59 mL/min/1.73   GFR calc Af Amer 110 >59 mL/min/1.73   BUN/Creatinine Ratio 17 9 - 23   Sodium 138 134 - 144 mmol/L   Potassium 3.2 (L) 3.5 - 5.2 mmol/L   Chloride 96 96 - 106 mmol/L   CO2 23 20 - 29 mmol/L   Calcium 9.8 8.7 - 10.2 mg/dL  Lipid Panel w/o Chol/HDL Ratio  Result Value Ref Range   Cholesterol, Total 246 (H) 100 - 199 mg/dL   Triglycerides 160 (H) 0 - 149 mg/dL   HDL 56 >39 mg/dL   VLDL Cholesterol Cal 29 5 - 40 mg/dL   LDL Chol Calc (NIH) 161 (H) 0 - 99 mg/dL  TSH  Result Value Ref Range   TSH 1.660 0.450 - 4.500 uIU/mL  VITAMIN D 25 Hydroxy  (Vit-D Deficiency, Fractures)  Result Value Ref Range   Vit D, 25-Hydroxy 23.9 (L) 30.0 - 100.0 ng/mL  HIV  Antibody (routine testing w rflx)  Result Value Ref Range   HIV Screen 4th Generation wRfx Non Reactive Non Reactive  Hepatitis C antibody  Result Value Ref Range   Hep C Virus Ab <0.1 0.0 - 0.9 s/co ratio      Assessment & Plan:   Problem List Items Addressed This Visit       Cardiovascular and Mediastinum   Essential hypertension    Chronic, stable with BP at goal.  Continue current medication regimen and adjust as needed -- could consider reduction to discontinuation of Hygroton.  BP at goal today in office.  Check BMP today.  Recommend occasional BP checks at home and documenting these for provider.  DASH diet focus.  Return in 6 months.      Relevant Orders   Basic metabolic panel     Other   Hyperlipidemia    Chronic, ongoing.  Continue current medication regimen and adjust as needed.  Lipid panel today.  Return in 6 months.      Relevant Orders   Lipid Panel w/o Chol/HDL Ratio   Overweight    BMI 29.59.  Recommended eating smaller high protein, low fat meals more frequently and exercising 30 mins a day 5 times a week with a goal of 10-15lb weight loss in the next 3 months. Patient voiced their understanding and motivation to adhere to these recommendations.       Depression, major, single episode, complete remission (HCC) - Primary    Chronic, ongoing stable without medication at this time.  Denies SI/HI.  Restart Wellbutrin as needed in future.  PHQ9 - 0 today.  Return in 6 months.        Follow up plan: Return in about 6 months (around 11/02/2021) for HTN/HLD, MOOD.

## 2021-05-02 NOTE — Assessment & Plan Note (Signed)
Chronic, ongoing stable without medication at this time.  Denies SI/HI.  Restart Wellbutrin as needed in future.  PHQ9 - 0 today.  Return in 6 months.

## 2021-05-02 NOTE — Assessment & Plan Note (Signed)
Chronic, stable with BP at goal.  Continue current medication regimen and adjust as needed -- could consider reduction to discontinuation of Hygroton.  BP at goal today in office.  Check BMP today.  Recommend occasional BP checks at home and documenting these for provider.  DASH diet focus.  Return in 6 months.

## 2021-05-02 NOTE — Assessment & Plan Note (Signed)
BMI 29.59.  Recommended eating smaller high protein, low fat meals more frequently and exercising 30 mins a day 5 times a week with a goal of 10-15lb weight loss in the next 3 months. Patient voiced their understanding and motivation to adhere to these recommendations.  

## 2021-05-02 NOTE — Patient Instructions (Signed)
Preventing High Cholesterol Cholesterol is a white, waxy substance similar to fat that the human body needs to help build cells. The liver makes all the cholesterol that a person's body needs. Having high cholesterol (hypercholesterolemia) increases your risk for heart disease and stroke. Extra or excess cholesterolcomes from the food that you eat. High cholesterol can often be prevented with diet and lifestyle changes. If you already have high cholesterol, you can control it with diet, lifestyle changes,and medicines. How can high cholesterol affect me? If you have high cholesterol, fatty deposits (plaques) may build up on the walls of your blood vessels. The blood vessels that carry blood away from your heart are called arteries. Plaques make the arteries narrower and stiffer. This in turn can: Restrict or block blood flow and cause blood clots to form. Increase your risk for heart attack and stroke. What can increase my risk for high cholesterol? This condition is more likely to develop in people who: Eat foods that are high in saturated fat or cholesterol. Saturated fat is mostly found in foods that come from animal sources. Are overweight. Are not getting enough exercise. Have a family history of high cholesterol (familial hypercholesterolemia). What actions can I take to prevent this? Nutrition  Eat less saturated fat. Avoid trans fats (partially hydrogenated oils). These are often found in margarine and in some baked goods, fried foods, and snacks bought in packages. Avoid precooked or cured meat, such as bacon, sausages, or meat loaves. Avoid foods and drinks that have added sugars. Eat more fruits, vegetables, and whole grains. Choose healthy sources of protein, such as fish, poultry, lean cuts of red meat, beans, peas, lentils, and nuts. Choose healthy sources of fat, such as: Nuts. Vegetable oils, especially olive oil. Fish that have healthy fats, such as omega-3 fatty acids.  These fish include mackerel or salmon.  Lifestyle Lose weight if you are overweight. Maintaining a healthy body mass index (BMI) can help prevent or control high cholesterol. It can also lower your risk for diabetes and high blood pressure. Ask your health care provider to help you with a diet and exercise plan to lose weight safely. Do not use any products that contain nicotine or tobacco, such as cigarettes, e-cigarettes, and chewing tobacco. If you need help quitting, ask your health care provider. Alcohol use Do not drink alcohol if: Your health care provider tells you not to drink. You are pregnant, may be pregnant, or are planning to become pregnant. If you drink alcohol: Limit how much you use to: 0-1 drink a day for women. 0-2 drinks a day for men. Be aware of how much alcohol is in your drink. In the [place] one drink equals one 12 oz bottle of beer (355 mL), one 5 oz glass of wine (148 mL), or one 1 oz glass of hard liquor (44 mL). Activity  Get enough exercise. Do exercises as told by your health care provider. Each week, do at least 150 minutes of exercise that takes a medium level of effort (moderate-intensity exercise). This kind of exercise: Makes your heart beat faster while allowing you to still be able to talk. Can be done in short sessions several times a day or longer sessions a few times a week. For example, on 5 days each week, you could walk fast or ride your bike 3 times a day for 10 minutes each time.  Medicines Your health care provider may recommend medicines to help lower cholesterol. This may be a medicine to lower  the amount of cholesterol that your liver makes. You may need medicine if: Diet and lifestyle changes have not lowered your cholesterol enough. You have high cholesterol and other risk factors for heart disease or stroke. Take over-the-counter and prescription medicines only as told by your health care provider. General information Manage your risk  factors for high cholesterol. Talk with your health care provider about all your risk factors and how to lower your risk. Manage other conditions that you have, such as diabetes or high blood pressure (hypertension). Have blood tests to check your cholesterol levels at regular points in time as told by your health care provider. Keep all follow-up visits as told by your health care provider. This is important. Where to find more information American Heart Association: www.heart.org National Heart, Lung, and Blood Institute: https://wilson-eaton.com/ Summary High cholesterol increases your risk for heart disease and stroke. By keeping your cholesterol level low, you can reduce your risk for these conditions. High cholesterol can often be prevented with diet and lifestyle changes. Work with your health care provider to manage your risk factors, and have your blood tested regularly. This information is not intended to replace advice given to you by your health care provider. Make sure you discuss any questions you have with your healthcare provider. Document Revised: 06/16/2019 Document Reviewed: 06/16/2019 Elsevier Patient Education  [place].

## 2021-05-06 LAB — BASIC METABOLIC PANEL
BUN/Creatinine Ratio: 17 (ref 9–23)
BUN: 12 mg/dL (ref 6–24)
CO2: 24 mmol/L (ref 20–29)
Calcium: 9.8 mg/dL (ref 8.7–10.2)
Chloride: 95 mmol/L — ABNORMAL LOW (ref 96–106)
Creatinine, Ser: 0.7 mg/dL (ref 0.57–1.00)
Glucose: 82 mg/dL (ref 65–99)
Potassium: 3.1 mmol/L — ABNORMAL LOW (ref 3.5–5.2)
Sodium: 138 mmol/L (ref 134–144)
eGFR: 107 mL/min/{1.73_m2} (ref >59)

## 2021-05-06 LAB — LIPID PANEL W/O CHOL/HDL RATIO
Cholesterol, Total: 235 mg/dL — ABNORMAL HIGH (ref 100–199)
HDL: 57 mg/dL (ref >39)
LDL Chol Calc (NIH): 148 mg/dL — ABNORMAL HIGH (ref 0–99)
Triglycerides: 168 mg/dL — ABNORMAL HIGH (ref 0–149)
VLDL Cholesterol Cal: 30 mg/dL (ref 5–40)

## 2021-05-06 MED ORDER — LOSARTAN POTASSIUM 25 MG PO TABS: 25.0000 mg | ORAL_TABLET | Freq: Every day | ORAL | 4 refills | Status: DC

## 2021-05-06 NOTE — Progress Notes (Signed)
Sent a lab note to patient via [place] with medication changes to be made.  She needs a follow-up scheduled in office for 4 weeks for BP recheck and labs.  Thanks

## 2021-05-06 NOTE — Addendum Note (Signed)
Addended by: Marnee Guarneri T on: 05/06/2021 11:24 AM   Modules accepted: Orders

## 2021-05-06 NOTE — Progress Notes (Signed)
Contacted via MyChart The 10-year ASCVD risk score Mikey Bussing DC Jr., et al., 2013) is: 1.2%   Values used to calculate the score:     Age: 46 years     Sex: Female     Is Non-Hispanic African American: No     Diabetic: No     Tobacco smoker: No     Systolic Blood Pressure: 433 mmHg     Is BP treated: Yes     HDL Cholesterol: 57 mg/dL     Total Cholesterol: 235 mg/dL   Good morning Julia Campbell, your labs have returned.  Cholesterol levels continue to show elevations, but triglycerides are close to goal.  Continue Lopid as ordered.  On your BMP, your kidney function is normal -- this is creatinine and eGFR.  Your potassium continues to be low though.  I want to stop your Chlorthalidone which you are taking for blood pressure and change to Losartan, which works a little bit differently and is kidney protective.  The Chlorthalidone is most likely pushing potassium too low.  I will have staff call Monday to have you follow-up in office in 4 weeks to see how you are doing with Losartan and recheck potassium.  Eat lots of potassium rich foods like bananas and avocados. Any questions? Keep being amazing!!  Thank you for allowing me to participate in your care.  I appreciate you. Kindest regards, Shrita Thien

## 2021-05-12 ENCOUNTER — Other Ambulatory Visit: Payer: Self-pay | Admitting: Nurse Practitioner

## 2021-05-12 MED ORDER — HYDROCHLOROTHIAZIDE 12.5 MG PO TABS: 12.5000 mg | ORAL_TABLET | Freq: Every day | ORAL | 3 refills | Status: DC

## 2021-05-30 ENCOUNTER — Other Ambulatory Visit: Payer: Self-pay | Admitting: Nurse Practitioner

## 2021-05-30 NOTE — Telephone Encounter (Signed)
Requested medications are due for refill today.  Unknown  Requested medications are on the active medications list.  no  Last refill. Unknown  Future visit scheduled.   yes  Notes to clinic.  This medication was discontinued 05/02/2021.

## 2021-06-03 ENCOUNTER — Other Ambulatory Visit: Payer: Self-pay | Admitting: Nurse Practitioner

## 2021-06-04 NOTE — Telephone Encounter (Signed)
Med was dc'd on 05/06/21 by Marnee Guarneri DNP  Called [facility] and pharmacist took med off her med list

## 2021-06-06 ENCOUNTER — Ambulatory Visit: Payer: 59 | Admitting: Nurse Practitioner

## 2021-06-26 ENCOUNTER — Other Ambulatory Visit: Payer: Self-pay

## 2021-06-26 ENCOUNTER — Ambulatory Visit (INDEPENDENT_AMBULATORY_CARE_PROVIDER_SITE_OTHER): Payer: 59 | Admitting: Nurse Practitioner

## 2021-06-26 ENCOUNTER — Other Ambulatory Visit: Payer: Self-pay | Admitting: Nurse Practitioner

## 2021-06-26 ENCOUNTER — Encounter: Payer: Self-pay | Admitting: Nurse Practitioner

## 2021-06-26 VITALS — BP 104/72 | HR 82 | Temp 98.6°F | Wt 175.4 lb

## 2021-06-26 DIAGNOSIS — I10 Essential (primary) hypertension: Secondary | ICD-10-CM

## 2021-06-26 DIAGNOSIS — E782 Mixed hyperlipidemia: Secondary | ICD-10-CM

## 2021-06-26 DIAGNOSIS — E876 Hypokalemia: Secondary | ICD-10-CM

## 2021-06-26 DIAGNOSIS — Z6831 Body mass index (BMI) 31.0-31.9, adult: Secondary | ICD-10-CM

## 2021-06-26 DIAGNOSIS — E6609 Other obesity due to excess calories: Secondary | ICD-10-CM

## 2021-06-26 MED ORDER — OZEMPIC (0.25 OR 0.5 MG/DOSE) 2 MG/1.5ML ~~LOC~~ SOPN: PEN_INJECTOR | SUBCUTANEOUS | 4 refills | Status: DC

## 2021-06-26 NOTE — Assessment & Plan Note (Signed)
Chronic, stable with BP at goal.  Continue current medication regimen and adjust as needed -- could consider reductions in future.  BP at goal today in office.  Check potassium today.  Recommend occasional BP checks at home and documenting these for provider.  DASH diet focus.  Return in 6 months.

## 2021-06-26 NOTE — Progress Notes (Signed)
BP 104/72   Pulse 82   Temp 98.6 F (37 C) (Oral)   Wt 175 lb 6.4 oz (79.6 kg)   SpO2 97%   BMI 31.57 kg/m    Subjective:    Patient ID: Julia Campbell, female    DOB: 26-May-1976, 45 y.o.   MRN: [id number]  HPI: Rexanne Inocencio is a 45 y.o. female  Chief Complaint  Patient presents with   Weight Management    Patient is here to discuss to weight management as she spoke with her insurance and was informed that they are not going to cover it. Patient states she is willing to pay out of pocket for medication but wanted to discuss with provider at today's visit. Patient states she the Phentermine helped but states you can only take it for 12 weeks and then you have to stop as she says she was told it is not good for long term use.   HYPERTENSION / HYPERLIPIDEMIA Currently on Losartan, HCTZ, and Lopid (only takes once a day).  Last labs her potassium was a low and we decreased her HCTZ to 12.5 MG daily.  She started on Phentermine & Topamax with OB/GYN in mid-December -- she has been off these several months now.  Is interested in starting on injectable like Ozempic or Wegovy and reports she will pay out of pocket for this.  Goal weight is 140 lbs. Satisfied with current treatment? yes Duration of hypertension: chronic BP monitoring frequency: not checking BP range:  BP medication side effects: no Duration of hyperlipidemia: chronic Cholesterol medication side effects: no Cholesterol supplements: none Medication compliance: good compliance Aspirin: no Recent stressors: no Recurrent headaches: no Visual changes: no Palpitations: no Dyspnea: no Chest pain: no Lower extremity edema: no Dizzy/lightheaded: no  The 10-year ASCVD risk score (Arnett DK, et al., 2019) is: 1%   Values used to calculate the score:     Age: 25 years     Sex: Female     Is Non-Hispanic African American: No     Diabetic: No     Tobacco smoker: No     Systolic Blood Pressure: 756 mmHg     Is BP treated:  Yes     HDL Cholesterol: 57 mg/dL     Total Cholesterol: 235 mg/dL   Relevant past medical, surgical, family and social history reviewed and updated as indicated. Interim medical history since our last visit reviewed. Allergies and medications reviewed and updated.  Review of Systems  Constitutional:  Negative for activity change, appetite change, diaphoresis, fatigue and fever.  Respiratory:  Negative for cough, chest tightness and shortness of breath.   Cardiovascular:  Negative for chest pain, palpitations and leg swelling.  Gastrointestinal: Negative.   Endocrine: Negative.   Neurological: Negative.   Psychiatric/Behavioral: Negative.     Per HPI unless specifically indicated above     Objective:    BP 104/72   Pulse 82   Temp 98.6 F (37 C) (Oral)   Wt 175 lb 6.4 oz (79.6 kg)   SpO2 97%   BMI 31.57 kg/m   Wt Readings from Last 3 Encounters:  06/26/21 175 lb 6.4 oz (79.6 kg)  05/02/21 164 lb 6.4 oz (74.6 kg)  11/03/20 159 lb (72.1 kg)    Physical Exam Vitals and nursing note reviewed.  Constitutional:      General: She is awake. She is not in acute distress.    Appearance: She is well-developed, well-groomed and overweight. She is not ill-appearing or  toxic-appearing.  HENT:     Head: Normocephalic.     Right Ear: Hearing normal.     Left Ear: Hearing normal.  Eyes:     General: Lids are normal.        Right eye: No discharge.        Left eye: No discharge.     Conjunctiva/sclera: Conjunctivae normal.     Pupils: Pupils are equal, round, and reactive to light.  Neck:     Thyroid: No thyromegaly.     Vascular: No carotid bruit.  Cardiovascular:     Rate and Rhythm: Normal rate and regular rhythm.     Heart sounds: Normal heart sounds. No murmur heard.   No gallop.  Pulmonary:     Effort: Pulmonary effort is normal. No accessory muscle usage or respiratory distress.     Breath sounds: Normal breath sounds.  Abdominal:     General: Bowel sounds are normal.      Palpations: Abdomen is soft.  Musculoskeletal:     Cervical back: Normal range of motion and neck supple.     Right lower leg: No edema.     Left lower leg: No edema.  Skin:    General: Skin is warm and dry.  Neurological:     Mental Status: She is alert and oriented to person, place, and time.  Psychiatric:        Attention and Perception: Attention normal.        Mood and Affect: Mood normal.        Behavior: Behavior normal. Behavior is cooperative.        Thought Content: Thought content normal.        Judgment: Judgment normal.    Results for orders placed or performed in visit on 04/88/89  Basic metabolic panel  Result Value Ref Range   Glucose 82 65 - 99 mg/dL   BUN 12 6 - 24 mg/dL   Creatinine, Ser 0.70 0.57 - 1.00 mg/dL   eGFR 107 >59 mL/min/1.73   BUN/Creatinine Ratio 17 9 - 23   Sodium 138 134 - 144 mmol/L   Potassium 3.1 (L) 3.5 - 5.2 mmol/L   Chloride 95 (L) 96 - 106 mmol/L   CO2 24 20 - 29 mmol/L   Calcium 9.8 8.7 - 10.2 mg/dL  Lipid Panel w/o Chol/HDL Ratio  Result Value Ref Range   Cholesterol, Total 235 (H) 100 - 199 mg/dL   Triglycerides 168 (H) 0 - 149 mg/dL   HDL 57 >39 mg/dL   VLDL Cholesterol Cal 30 5 - 40 mg/dL   LDL Chol Calc (NIH) 148 (H) 0 - 99 mg/dL      Assessment & Plan:   Problem List Items Addressed This Visit       Cardiovascular and Mediastinum   Essential hypertension - Primary    Chronic, stable with BP at goal.  Continue current medication regimen and adjust as needed -- could consider reductions in future.  BP at goal today in office.  Check potassium today.  Recommend occasional BP checks at home and documenting these for provider.  DASH diet focus.  Return in 6 months.        Other   Hyperlipidemia    Chronic, ongoing.  Continue current medication regimen and adjust as needed.  Lipid panel up to date. Return in 6 months.      Obesity    BMI 31.57 with goal of weight loss -- at this time will print script  for Ozempic  and see if less costly OOP for patient, if not then send in [facility].  She wishes to try these and has not family history of thyroid cancer.  Recommended eating smaller high protein, low fat meals more frequently and exercising 30 mins a day 5 times a week with a goal of 10-15lb weight loss in the next 3 months. Patient voiced their understanding and motivation to adhere to these recommendations.       Other Visit Diagnoses     Hypokalemia       Check potassium today        Follow up plan: Return for as scheduled in February.

## 2021-06-26 NOTE — Patient Instructions (Signed)
Semaglutide Injection What is this medication? SEMAGLUTIDE (SEM a GLOO tide) treats type 2 diabetes. It works by increasing insulin levels in your body, which decreases your blood sugar (glucose). It also reduces the amount of sugar released into the blood and slows down your digestion. It can also be used to lower the risk of heart attack and stroke in people with type 2 diabetes. Changes to diet and exercise are often combined with this medication. This medicine may be used for other purposes; ask your health care provider or pharmacist if you have questions. COMMON BRAND NAME(S): OZEMPIC What should I tell my care team before I take this medication? They need to know if you have any of these conditions: Endocrine tumors (MEN 2) or if someone in your family had these tumors Eye disease, vision problems History of pancreatitis Kidney disease Stomach problems Thyroid cancer or if someone in your family had thyroid cancer An unusual or allergic reaction to semaglutide, other medications, foods, dyes, or preservatives Pregnant or trying to get pregnant Breast-feeding How should I use this medication? This medication is for injection under the skin of your upper leg (thigh), stomach area, or upper arm. It is given once every week (every 7 days). You will be taught how to prepare and give this medication. Use exactly as directed. Take your medication at regular intervals. Do not take it more often than directed. If you use this medication with insulin, you should inject this medication and the insulin separately. Do not mix them together. Do not give the injections right next to each other. Change (rotate) injection sites with each injection. It is important that you put your used needles and syringes in a special sharps container. Do not put them in a trash can. If you do not have a sharps container, call your pharmacist or care team to get one. A special MedGuide will be given to you by the  pharmacist with each prescription and refill. Be sure to read this information carefully each time. This medication comes with INSTRUCTIONS FOR USE. Ask your pharmacist for directions on how to use this medication. Read the information carefully. Talk to your pharmacist or care team if you have questions. Talk to your care team about the use of this medication in children. Special care may be needed. Overdosage: If you think you have taken too much of this medicine contact a poison control center or emergency room at once. NOTE: This medicine is only for you. Do not share this medicine with others. What if I miss a dose? If you miss a dose, take it as soon as you can within 5 days after the missed dose. Then take your next dose at your regular weekly time. If it has been longer than 5 days after the missed dose, do not take the missed dose. Take the next dose at your regular time. Do not take double or extra doses. If you have questions about a missed dose, contact your care team for advice. What may interact with this medication? Other medications for diabetes Many medications may cause changes in blood sugar, these include: Alcohol containing beverages Antiviral medications for HIV or AIDS Aspirin and aspirin-like medications Certain medications for blood pressure, heart disease, irregular heart beat Chromium Diuretics Female hormones, such as estrogens or progestins, birth control pills Fenofibrate Gemfibrozil Isoniazid Lanreotide Female hormones or anabolic steroids MAOIs like Carbex, Eldepryl, Marplan, Nardil, and Parnate Medications for weight loss Medications for allergies, asthma, cold, or cough Medications for depression,   anxiety, or psychotic disturbances Niacin Nicotine NSAIDs, medications for pain and inflammation, like ibuprofen or naproxen Octreotide Pasireotide Pentamidine Phenytoin Probenecid Quinolone antibiotics such as ciprofloxacin, levofloxacin, ofloxacin Some  herbal dietary supplements Steroid medications such as prednisone or cortisone Sulfamethoxazole; trimethoprim Thyroid hormones Some medications can hide the warning symptoms of low blood sugar (hypoglycemia). You may need to monitor your blood sugar more closely if you are taking one of these medications. These include: Beta-blockers, often used for high blood pressure or heart problems (examples include atenolol, metoprolol, propranolol) Clonidine Guanethidine Reserpine This list may not describe all possible interactions. Give your health care provider a list of all the medicines, herbs, non-prescription drugs, or dietary supplements you use. Also tell them if you smoke, drink alcohol, or use illegal drugs. Some items may interact with your medicine. What should I watch for while using this medication? Visit your care team for regular checks on your progress. Drink plenty of fluids while taking this medication. Check with your care team if you get an attack of severe diarrhea, nausea, and vomiting. The loss of too much body fluid can make it dangerous for you to take this medication. A test called the HbA1C (A1C) will be monitored. This is a simple blood test. It measures your blood sugar control over the last 2 to 3 months. You will receive this test every 3 to 6 months. Learn how to check your blood sugar. Learn the symptoms of low and high blood sugar and how to manage them. Always carry a quick-source of sugar with you in case you have symptoms of low blood sugar. Examples include hard sugar candy or glucose tablets. Make sure others know that you can choke if you eat or drink when you develop serious symptoms of low blood sugar, such as seizures or unconsciousness. They must get medical help at once. Tell your care team if you have high blood sugar. You might need to change the dose of your medication. If you are sick or exercising more than usual, you might need to change the dose of your  medication. Do not skip meals. Ask your care team if you should avoid alcohol. Many nonprescription cough and cold products contain sugar or alcohol. These can affect blood sugar. Pens should never be shared. Even if the needle is changed, sharing may result in passing of viruses like hepatitis or HIV. Wear a medical ID bracelet or chain, and carry a card that describes your disease and details of your medication and dosage times. Do not become pregnant while taking this medication. Women should inform their care team if they wish to become pregnant or think they might be pregnant. There is a potential for serious side effects to an unborn child. Talk to your care team for more information. What side effects may I notice from receiving this medication? Side effects that you should report to your care team as soon as possible: Allergic reactions-skin rash, itching, hives, swelling of the face, lips, tongue, or throat Change in vision Dehydration-increased thirst, dry mouth, feeling faint or lightheaded, headache, dark yellow or brown urine Gallbladder problems-severe stomach pain, nausea, vomiting, fever Heart palpitations-rapid, pounding, or irregular heartbeat Kidney injury-decrease in the amount of urine, swelling of the ankles, hands, or feet Pancreatitis-severe stomach pain that spreads to your back or gets worse after eating or when touched, fever, nausea, vomiting Thyroid cancer-new mass or lump in the neck, pain or trouble swallowing, trouble breathing, hoarseness Side effects that usually do not require medical  attention (report to your care team if they continue or are bothersome): Diarrhea Loss of appetite Nausea Stomach pain Vomiting This list may not describe all possible side effects. Call your doctor for medical advice about side effects. You may report side effects to FDA at 1-800-FDA-1088. Where should I keep my medication? Keep out of the reach of children. Store unopened  pens in a refrigerator between 2 and 8 degrees C (36 and 46 degrees F). Do not freeze. Protect from light and heat. After you first use the pen, it can be stored for 56 days at room temperature between 15 and 30 degrees C (59 and 86 degrees F) or in a refrigerator. Throw away your used pen after 56 days or after the expiration date, whichever comes first. Do not store your pen with the needle attached. If the needle is left on, medication may leak from the pen. NOTE: This sheet is a summary. It may not cover all possible information. If you have questions about this medicine, talk to your doctor, pharmacist, or health care provider.  2022 Elsevier/Gold Standard (2020-10-31 16:01:53)

## 2021-06-26 NOTE — Assessment & Plan Note (Signed)
Chronic, ongoing.  Continue current medication regimen and adjust as needed.  Lipid panel up to date. Return in 6 months.

## 2021-06-26 NOTE — Progress Notes (Unsigned)
   There were no vitals taken for this visit.   Subjective:    Patient ID: Julia Campbell, female    DOB: 1976-07-21, 45 y.o.   MRN: [id number]  HPI: Julia Campbell is a 45 y.o. female  Chief Complaint  Patient presents with   Hypertension   Hyperlipidemia   Obesity   HYPERTENSION / HYPERLIPIDEMIA Currently Losartan 25 MG and HCTZ 12.5 MG, changed due to low potassium levels recently.  Still taking Lopid.    She is interested in weight loss -- is willing to pay out of pocket for Ozempic or Wegovy.  In past has taken Phentermine. Goal weight is 140 lbs.  She is not exercising much at this time.   Satisfied with current treatment? yes Duration of hypertension: chronic BP monitoring frequency: not checking BP range:  BP medication side effects: no Duration of hyperlipidemia: chronic Cholesterol medication side effects: no Cholesterol supplements: none Medication compliance: good compliance Aspirin: no Recent stressors: no Recurrent headaches: no Visual changes: no Palpitations: no Dyspnea: no Chest pain: no Lower extremity edema: no Dizzy/lightheaded: no  The 10-year ASCVD risk score (Arnett DK, et al., 2019) is: 1%   Values used to calculate the score:     Age: 45 years     Sex: Female     Is Non-Hispanic African American: No     Diabetic: No     Tobacco smoker: No     Systolic Blood Pressure: 450 mmHg     Is BP treated: Yes     HDL Cholesterol: 57 mg/dL     Total Cholesterol: 235 mg/dL   Relevant past medical, surgical, family and social history reviewed and updated as indicated. Interim medical history since our last visit reviewed. Allergies and medications reviewed and updated.  Review of Systems  Per HPI unless specifically indicated above     Objective:    There were no vitals taken for this visit.  Wt Readings from Last 3 Encounters:  06/26/21 175 lb 6.4 oz (79.6 kg)  05/02/21 164 lb 6.4 oz (74.6 kg)  11/03/20 159 lb (72.1 kg)    Physical  Exam  Results for orders placed or performed in visit on 38/88/28  Basic metabolic panel  Result Value Ref Range   Glucose 82 65 - 99 mg/dL   BUN 12 6 - 24 mg/dL   Creatinine, Ser 0.70 0.57 - 1.00 mg/dL   eGFR 107 >59 mL/min/1.73   BUN/Creatinine Ratio 17 9 - 23   Sodium 138 134 - 144 mmol/L   Potassium 3.1 (L) 3.5 - 5.2 mmol/L   Chloride 95 (L) 96 - 106 mmol/L   CO2 24 20 - 29 mmol/L   Calcium 9.8 8.7 - 10.2 mg/dL  Lipid Panel w/o Chol/HDL Ratio  Result Value Ref Range   Cholesterol, Total 235 (H) 100 - 199 mg/dL   Triglycerides 168 (H) 0 - 149 mg/dL   HDL 57 >39 mg/dL   VLDL Cholesterol Cal 30 5 - 40 mg/dL   LDL Chol Calc (NIH) 148 (H) 0 - 99 mg/dL      Assessment & Plan:   Problem List Items Addressed This Visit   None Visit Diagnoses     Hypokalemia    -  Primary   Relevant Orders   Basic metabolic panel        Follow up plan: No follow-ups on file.

## 2021-06-26 NOTE — Assessment & Plan Note (Signed)
BMI 31.57 with goal of weight loss -- at this time will print script for Ozempic and see if less costly OOP for patient, if not then send in [facility].  She wishes to try these and has not family history of thyroid cancer.  Recommended eating smaller high protein, low fat meals more frequently and exercising 30 mins a day 5 times a week with a goal of 10-15lb weight loss in the next 3 months. Patient voiced their understanding and motivation to adhere to these recommendations.

## 2021-06-27 LAB — POTASSIUM: Potassium: 3.9 mmol/L (ref 3.5–5.2)

## 2021-06-27 NOTE — Progress Notes (Signed)
Contacted via MyChart   Good morning Julia Campbell, your potassium returned and is now normal.  I would continue current medications at this time and diet focus. Any questions? Keep being stellar!!  Thank you for allowing me to participate in your care.  I appreciate you. Kindest regards, Chane Magner

## 2021-07-26 ENCOUNTER — Ambulatory Visit: Payer: 59 | Admitting: Obstetrics & Gynecology

## 2021-08-07 ENCOUNTER — Other Ambulatory Visit: Payer: Self-pay | Admitting: Obstetrics & Gynecology

## 2021-08-07 ENCOUNTER — Other Ambulatory Visit (HOSPITAL_COMMUNITY)
Admission: RE | Admit: 2021-08-07 | Discharge: 2021-08-07 | Disposition: A | Discharge status: Home / Self Care | Payer: 59 | Source: Ambulatory Visit | Attending: Obstetrics & Gynecology | Admitting: Obstetrics & Gynecology

## 2021-08-07 ENCOUNTER — Ambulatory Visit (INDEPENDENT_AMBULATORY_CARE_PROVIDER_SITE_OTHER): Payer: 59 | Admitting: Obstetrics & Gynecology

## 2021-08-07 ENCOUNTER — Encounter: Payer: Self-pay | Admitting: Obstetrics & Gynecology

## 2021-08-07 ENCOUNTER — Other Ambulatory Visit: Payer: Self-pay

## 2021-08-07 VITALS — BP 128/82 | Ht 62.5 in | Wt 177.0 lb

## 2021-08-07 DIAGNOSIS — D251 Intramural leiomyoma of uterus: Secondary | ICD-10-CM | POA: Diagnosis not present

## 2021-08-07 DIAGNOSIS — Z1231 Encounter for screening mammogram for malignant neoplasm of breast: Secondary | ICD-10-CM

## 2021-08-07 DIAGNOSIS — N870 Mild cervical dysplasia: Secondary | ICD-10-CM | POA: Insufficient documentation

## 2021-08-07 DIAGNOSIS — E669 Obesity, unspecified: Secondary | ICD-10-CM | POA: Diagnosis not present

## 2021-08-07 DIAGNOSIS — Z01419 Encounter for gynecological examination (general) (routine) without abnormal findings: Secondary | ICD-10-CM | POA: Diagnosis not present

## 2021-08-07 NOTE — Progress Notes (Signed)
HPI:      Ms. Licet Dunphy is a 45 y.o. [id number] who LMP was Patient's last menstrual period was 08/05/2021., she presents today for her annual examination. The patient has no complaints today. The patient is sexually active. Her last pap: approximate date 2021 and was normal and last mammogram: approximate date 2021 at [facility] and was normal.  Cervical Hx: 2014 LGSIL, Colpo Bx CIN I 2016 and 2017 ASCUS 2018 LGSIL 2018 Colpo Bx CIN I Cryo 11/2017 2019 Normal PAP 2020 Normal PAP 2021 Normal PAP The patient does perform self breast exams.  There is no notable family history of breast or ovarian cancer in her family.  The patient has regular exercise: yes.  The patient denies current symptoms of depression.    GYN History: Contraception: tubal ligation  PMHx: Past Medical History:  Diagnosis Date   Anxiety    Cervical dysplasia    CIN1 on bx 2014   Cervical high risk human papillomavirus (HPV) DNA test positive since 2013   Constipation    Depression    Hyperlipidemia    Hypertension    LGSIL on Pap smear of cervix 03/2017   Obesity    Vaginal high risk HPV DNA test positive 03/2017   Past Surgical History:  Procedure Laterality Date   COLPOSCOPY     TUBAL LIGATION     Family History  Problem Relation Age of Onset   Kidney disease Mother        Stage 5   Hypertension Mother    Obesity Mother    Stroke Maternal Grandmother    Heart disease Maternal Grandmother    Hypertension Father    Throat cancer Father        late 34s   Heart attack Maternal Grandfather    Cancer Neg Hx    Diabetes Neg Hx    Social History   Tobacco Use   Smoking status: Never   Smokeless tobacco: Never  Vaping Use   Vaping Use: Never used  Substance Use Topics   Alcohol use: No    Alcohol/week: 0.0 standard drinks   Drug use: No    Current Outpatient Medications:    gemfibrozil (LOPID) 600 MG tablet, TAKE 1 TABLET BY MOUTH TWICE A DAY BEFORE MEALS, Disp: 180 tablet, Rfl: 1    hydrochlorothiazide (HYDRODIURIL) 12.5 MG tablet, Take 1 tablet (12.5 mg total) by mouth daily., Disp: 90 tablet, Rfl: 3   losartan (COZAAR) 25 MG tablet, Take 1 tablet (25 mg total) by mouth daily., Disp: 90 tablet, Rfl: 4   Semaglutide,0.25 or 0.5MG /DOS, (OZEMPIC, 0.25 OR 0.5 MG/DOSE,) 2 MG/1.5ML SOPN, Start with 0.25MG  once a week x 4 weeks, then increase to 0.5MG  weekly., Disp: 4.5 mL, Rfl: 4 Allergies: Patient has no known allergies.  Review of Systems  Constitutional:  Negative for chills, fever and malaise/fatigue.  HENT:  Negative for congestion, sinus pain and sore throat.   Eyes:  Negative for blurred vision and pain.  Respiratory:  Negative for cough and wheezing.   Cardiovascular:  Negative for chest pain and leg swelling.  Gastrointestinal:  Negative for abdominal pain, constipation, diarrhea, heartburn, nausea and vomiting.  Genitourinary:  Negative for dysuria, frequency, hematuria and urgency.  Musculoskeletal:  Negative for back pain, joint pain, myalgias and neck pain.  Skin:  Negative for itching and rash.  Neurological:  Negative for dizziness, tremors and weakness.  Endo/Heme/Allergies:  Does not bruise/bleed easily.  Psychiatric/Behavioral:  Negative for depression. The patient is not nervous/anxious  and does not have insomnia.    Objective: BP 128/82   Ht 5' 2.5" (1.588 m)   Wt 177 lb (80.3 kg)   LMP 08/05/2021   BMI 31.86 kg/m   Filed Weights   08/07/21 0801  Weight: 177 lb (80.3 kg)   Body mass index is 31.86 kg/m. Physical Exam Constitutional:      General: She is not in acute distress.    Appearance: She is well-developed.  Genitourinary:     Bladder, rectum and urethral meatus normal.     No lesions in the vagina.     Right Labia: No rash, tenderness or lesions.    Left Labia: No tenderness, lesions or rash.    No vaginal bleeding.      Right Adnexa: not tender and no mass present.    Left Adnexa: not tender and no mass present.    No cervical  motion tenderness, friability, lesion or polyp.     Uterus is enlarged.     No uterine mass detected.    Uterus exam comments: 10 week size.     Uterus is midaxial.     Pelvic exam was performed with patient in the lithotomy position.  Breasts:    Right: No mass, skin change or tenderness.     Left: No mass, skin change or tenderness.  HENT:     Head: Normocephalic and atraumatic. No laceration.     Right Ear: Hearing normal.     Left Ear: Hearing normal.     Mouth/Throat:     Pharynx: Uvula midline.  Eyes:     Pupils: Pupils are equal, round, and reactive to light.  Neck:     Thyroid: No thyromegaly.  Cardiovascular:     Rate and Rhythm: Normal rate and regular rhythm.     Heart sounds: No murmur heard.   No friction rub. No gallop.  Pulmonary:     Effort: Pulmonary effort is normal. No respiratory distress.     Breath sounds: Normal breath sounds. No wheezing.  Abdominal:     General: Bowel sounds are normal. There is no distension.     Palpations: Abdomen is soft.     Tenderness: There is no abdominal tenderness. There is no rebound.  Musculoskeletal:        General: Normal range of motion.     Cervical back: Normal range of motion and neck supple.  Neurological:     Mental Status: She is alert and oriented to person, place, and time.     Cranial Nerves: No cranial nerve deficit.  Skin:    General: Skin is warm and dry.  Psychiatric:        Judgment: Judgment normal.  Vitals reviewed.    Assessment:  ANNUAL EXAM 1. Women's annual routine gynecological examination   2. CIN I (cervical intraepithelial neoplasia I)   3. Obesity (BMI 30-39.9)   4. Intramural leiomyoma of uterus   5. Encounter for screening mammogram for malignant neoplasm of breast      Screening Plan:            1.  Cervical Screening-  Pap smear done today  2. Breast screening- Exam annually and mammogram>40 planned   3. Colonoscopy every 10 years rec after age 52 (or Cologuard)   29. Labs  managed by PCP  5. Counseling for contraception: bilateral tubal ligation  6. Obesity (BMI 30-39.9) Pt trying Ozempic w PCP, as has gained 25+ lbs since last year when we lost  weight w Phentermine Encouraged as to diet exercise and use of meds such as Ozempic  7. Intramural leiomyoma of uterus Monitor cycles and any other signs of fibroid growth Options for management revisited     F/U  Return in about 1 year (around 08/07/2022) for Annual.  Barnett Applebaum, MD, [facility], [facility] 08/07/2021  8:23 AM

## 2021-08-07 NOTE — Patient Instructions (Signed)
PAP every year Mammogram every year    At [facility] Colonoscopy every 10 years - consider soon Labs yearly (with PCP)  Thank you for choosing [facility]. As part of our ongoing efforts to improve patient experience, we would appreciate your feedback. Please fill out the short survey that you will receive by mail or MyChart. Your opinion is important to [name]! - Dr. Kenton Kingfisher  Recommendations to boost your immunity to prevent illness such as viral flu and colds, including covid19, are as follows:       - - -  Vitamin K2 and Vitamin D3  - - - Take Vitamin K2 at 200-300 mcg daily (usually 2-3 pills daily of the over the counter formulation). Take Vitamin D3 at 3000-4000 U daily (usually 3-4 pills daily of the over the counter formulation). Studies show that these two at high normal levels in your system are very effective in keeping your immunity so strong and protective that you will be unlikely to contract viral illness such as those listed above.  Dr Kenton Kingfisher

## 2021-08-15 LAB — CYTOLOGY - PAP: Diagnosis: NEGATIVE

## 2021-08-22 ENCOUNTER — Encounter: Payer: Self-pay | Admitting: Nurse Practitioner

## 2021-09-14 MED ORDER — SEMAGLUTIDE (1 MG/DOSE) 4 MG/3ML ~~LOC~~ SOPN: 1.0000 mg | PEN_INJECTOR | SUBCUTANEOUS | 4 refills | Status: DC

## 2021-10-17 ENCOUNTER — Encounter: Payer: Self-pay | Admitting: Nurse Practitioner

## 2021-10-17 ENCOUNTER — Other Ambulatory Visit: Payer: Self-pay | Admitting: Obstetrics & Gynecology

## 2021-10-17 DIAGNOSIS — Z1231 Encounter for screening mammogram for malignant neoplasm of breast: Secondary | ICD-10-CM

## 2021-10-29 NOTE — Patient Instructions (Incomplete)

## 2021-11-02 ENCOUNTER — Ambulatory Visit: Payer: 59 | Admitting: Nurse Practitioner

## 2021-11-02 DIAGNOSIS — I10 Essential (primary) hypertension: Secondary | ICD-10-CM

## 2021-11-02 DIAGNOSIS — F325 Major depressive disorder, single episode, in full remission: Secondary | ICD-10-CM

## 2021-11-02 DIAGNOSIS — E6609 Other obesity due to excess calories: Secondary | ICD-10-CM

## 2021-11-02 DIAGNOSIS — E782 Mixed hyperlipidemia: Secondary | ICD-10-CM

## 2021-11-02 DIAGNOSIS — E559 Vitamin D deficiency, unspecified: Secondary | ICD-10-CM

## 2021-11-20 ENCOUNTER — Other Ambulatory Visit: Payer: Self-pay

## 2021-11-20 ENCOUNTER — Ambulatory Visit
Admission: RE | Admit: 2021-11-20 | Discharge: 2021-11-20 | Disposition: A | Discharge status: Home / Self Care | Payer: 59 | Source: Ambulatory Visit | Attending: Obstetrics & Gynecology | Admitting: Obstetrics & Gynecology

## 2021-11-20 DIAGNOSIS — Z1231 Encounter for screening mammogram for malignant neoplasm of breast: Secondary | ICD-10-CM | POA: Insufficient documentation

## 2021-11-21 ENCOUNTER — Other Ambulatory Visit: Payer: Self-pay | Admitting: *Deleted

## 2021-11-21 ENCOUNTER — Inpatient Hospital Stay
Admission: RE | Admit: 2021-11-21 | Discharge: 2021-11-21 | Disposition: A | Discharge status: Home / Self Care | Payer: Self-pay | Source: Ambulatory Visit | Attending: *Deleted | Admitting: *Deleted

## 2021-11-21 ENCOUNTER — Ambulatory Visit: Payer: 59 | Admitting: Nurse Practitioner

## 2021-11-21 ENCOUNTER — Other Ambulatory Visit: Payer: Self-pay | Admitting: Obstetrics & Gynecology

## 2021-11-21 DIAGNOSIS — Z1231 Encounter for screening mammogram for malignant neoplasm of breast: Secondary | ICD-10-CM

## 2021-11-22 ENCOUNTER — Other Ambulatory Visit: Payer: Self-pay | Admitting: Obstetrics & Gynecology

## 2021-11-22 DIAGNOSIS — N63 Unspecified lump in unspecified breast: Secondary | ICD-10-CM

## 2021-11-22 DIAGNOSIS — R928 Other abnormal and inconclusive findings on diagnostic imaging of breast: Secondary | ICD-10-CM

## 2021-11-26 ENCOUNTER — Other Ambulatory Visit: Payer: Self-pay | Admitting: Nurse Practitioner

## 2021-11-27 NOTE — Telephone Encounter (Signed)
Requested medications are due for refill today.  yes ? ?Requested medications are on the active medications list.  yes ? ?Last refill. 11/09/2020 #180 1 refill ? ?Future visit scheduled.   yes ? ?Notes to clinic.  Failed protocol d/t expired labs. ? ? ? ?Requested Prescriptions  ?Pending Prescriptions Disp Refills  ? gemfibrozil (LOPID) 600 MG tablet [Pharmacy Med Name: GEMFIBROZIL 600 MG TABLET] 60 tablet 17  ?  Sig: TAKE 1 TABLET BY MOUTH TWICE A DAY BEFORE MEALS  ?  ? Cardiovascular:  Antilipid - Fibric Acid Derivatives Failed - 11/26/2021  1:18 AM  ?  ?  Failed - ALT in normal range and within 360 days  ?  ALT  ?Date Value Ref Range Status  ?11/16/2019 11 0 - 32 IU/L Final  ?  ?  ?  ?  Failed - AST in normal range and within 360 days  ?  AST  ?Date Value Ref Range Status  ?11/16/2019 12 0 - 40 IU/L Final  ?  ?  ?  ?  Failed - HGB in normal range and within 360 days  ?  Hemoglobin  ?Date Value Ref Range Status  ?11/16/2019 13.7 11.1 - 15.9 g/dL Final  ?  ?  ?  ?  Failed - HCT in normal range and within 360 days  ?  Hematocrit  ?Date Value Ref Range Status  ?11/16/2019 41.0 34.0 - 46.6 % Final  ?  ?  ?  ?  Failed - PLT in normal range and within 360 days  ?  Platelets  ?Date Value Ref Range Status  ?11/16/2019 466 (H) 150 - 450 x10E3/uL Final  ?  ?  ?  ?  Failed - WBC in normal range and within 360 days  ?  WBC  ?Date Value Ref Range Status  ?11/16/2019 12.0 (H) 3.4 - 10.8 x10E3/uL Final  ?  ?  ?  ?  Failed - Lipid Panel in normal range within the last 12 months  ?  Cholesterol, Total  ?Date Value Ref Range Status  ?05/02/2021 235 (H) 100 - 199 mg/dL Final  ? ?Cholesterol [place]colo, [place]  ?Date Value Ref Range Status  ?04/04/2016 223 (H) <200 mg/dL Final  ?  Comment:  ?                          Desirable                <200 ?                        Borderline High      200- 239 ?                        High                     >239 ?  ? ?LDL Chol Calc (NIH)  ?Date Value Ref Range Status  ?05/02/2021 148 (H) 0 - 99  mg/dL Final  ? ?HDL  ?Date Value Ref Range Status  ?05/02/2021 57 >39 mg/dL Final  ? ?Triglycerides  ?Date Value Ref Range Status  ?05/02/2021 168 (H) 0 - 149 mg/dL Final  ? ?Triglycerides Piccolo,Waived  ?Date Value Ref Range Status  ?04/04/2016 203 (H) <150 mg/dL Final  ?  Comment:  ?  Normal                   <150 ?                        Borderline High     150 - 199 ?                        High                200 - 499 ?                        Very High                >499 ?  ? ?  ?  ?  Passed - Cr in normal range and within 360 days  ?  Creatinine, Ser  ?Date Value Ref Range Status  ?05/02/2021 0.70 0.57 - 1.00 mg/dL Final  ?  ?  ?  ?  Passed - eGFR is 30 or above and within 360 days  ?  GFR calc Af Amer  ?Date Value Ref Range Status  ?11/01/2020 110 >59 mL/min/1.73 Final  ?  Comment:  ?  **In accordance with recommendations from the NKF-ASN Task force,** ?  [facility] is in the process of updating its eGFR calculation to the ?  2021 CKD-EPI creatinine equation that estimates kidney function ?  without a race variable. ?  ? ?GFR calc non Af Amer  ?Date Value Ref Range Status  ?11/01/2020 95 >59 mL/min/1.73 Final  ? ?eGFR  ?Date Value Ref Range Status  ?05/02/2021 107 >59 mL/min/1.73 Final  ?  ?  ?  ?  Passed - Valid encounter within last 12 months  ?  Recent Outpatient Visits   ? ?      ? 5 months ago Essential hypertension  ? [facility] [place], John Sevier T, NP  ? 6 months ago Depression, major, single episode, complete remission ([place])  ? [facility] [place], [employer] T, NP  ? 9 months ago COVID-19  ? [place], NP  ? 1 year ago Depression, major, single episode, complete remission ([place])  ? [place], [place] T, NP  ? 1 year ago Depression, major, single episode, complete remission ([place])  ? [facility] [place], Henrine Screws T, NP  ? ?  ?  ?Future Appointments   ? ?        ? In 1 week [place], Barbaraann Faster, NP  [facility] [employer]  ? ?  ? ?  ?  ?  ?  ?

## 2021-12-03 NOTE — Patient Instructions (Signed)
Healthy Eating ?Following a healthy eating pattern may help you to achieve and maintain a healthy body weight, reduce the risk of chronic disease, and live a long and productive life. It is important to follow a healthy eating pattern at an appropriate calorie level for your body. Your nutritional needs should be met primarily through food by choosing a variety of nutrient-rich foods. ?What are tips for following this plan? ?Reading food labels ?Read labels and choose the following: ?Reduced or low sodium. ?Juices with 100% fruit juice. ?Foods with low saturated fats and high polyunsaturated and monounsaturated fats. ?Foods with whole grains, such as whole wheat, cracked wheat, brown rice, and wild rice. ?Whole grains that are fortified with folic acid. This is recommended for women who are pregnant or who want to become pregnant. ?Read labels and avoid the following: ?Foods with a lot of added sugars. These include foods that contain brown sugar, corn sweetener, corn syrup, dextrose, fructose, glucose, high-fructose corn syrup, honey, invert sugar, lactose, malt syrup, maltose, molasses, raw sugar, sucrose, trehalose, or turbinado sugar. ?Do not eat more than the following amounts of added sugar per day: ?6 teaspoons (25 g) for women. ?9 teaspoons (38 g) for men. ?Foods that contain processed or refined starches and grains. ?Refined grain products, such as white flour, degermed cornmeal, white bread, and white rice. ?Shopping ?Choose nutrient-rich snacks, such as vegetables, whole fruits, and nuts. Avoid high-calorie and high-sugar snacks, such as potato chips, fruit snacks, and candy. ?Use oil-based dressings and spreads on foods instead of solid fats such as butter, stick margarine, or cream cheese. ?Limit pre-made sauces, mixes, and "instant" products such as flavored rice, instant noodles, and ready-made pasta. ?Try more plant-protein sources, such as tofu, tempeh, black beans, edamame, lentils, nuts, and  seeds. ?Explore eating plans such as the Mediterranean diet or vegetarian diet. ?Cooking ?Use oil to saut? or stir-fry foods instead of solid fats such as butter, stick margarine, or lard. ?Try baking, boiling, grilling, or broiling instead of frying. ?Remove the fatty part of meats before cooking. ?Steam vegetables in water or broth. ?Meal planning ? ?At meals, imagine dividing your plate into fourths: ?One-half of your plate is fruits and vegetables. ?One-fourth of your plate is whole grains. ?One-fourth of your plate is protein, especially lean meats, poultry, eggs, tofu, beans, or nuts. ?Include low-fat dairy as part of your daily diet. ?Lifestyle ?Choose healthy options in all settings, including home, work, school, restaurants, or stores. ?Prepare your food safely: ?Wash your hands after handling raw meats. ?Keep food preparation surfaces clean by regularly washing with hot, soapy water. ?Keep raw meats separate from ready-to-eat foods, such as fruits and vegetables. ?Cook seafood, meat, poultry, and eggs to the recommended internal temperature. ?Store foods at safe temperatures. In general: ?Keep cold foods at 40?F (4.4?C) or below. ?Keep hot foods at 140?F (60?C) or above. ?Keep your freezer at 0?F (-17.8?C) or below. ?Foods are no longer safe to eat when they have been between the temperatures of 40?-140?F (4.4-60?C) for more than 2 hours. ?What foods should I eat? ?Fruits ?Aim to eat 2 cup-equivalents of fresh, canned (in natural juice), or frozen fruits each day. Examples of 1 cup-equivalent of fruit include 1 small apple, 8 large strawberries, 1 cup canned fruit, ? cup dried fruit, or 1 cup 100% juice. ?Vegetables ?Aim to eat 2?-3 cup-equivalents of fresh and frozen vegetables each day, including different varieties and colors. Examples of 1 cup-equivalent of vegetables include 2 medium carrots, 2 cups raw,  leafy greens, 1 cup chopped vegetable (raw or cooked), or 1 medium baked potato. ?Grains ?Aim to  eat 6 ounce-equivalents of whole grains each day. Examples of 1 ounce-equivalent of grains include 1 slice of bread, 1 cup ready-to-eat cereal, 3 cups popcorn, or ? cup cooked rice, pasta, or cereal. ?Meats and other proteins ?Aim to eat 5-6 ounce-equivalents of protein each day. Examples of 1 ounce-equivalent of protein include 1 egg, 1/2 cup nuts or seeds, or 1 tablespoon (16 g) peanut butter. A cut of meat or fish that is the size of a deck of cards is about 3-4 ounce-equivalents. ?Of the protein you eat each week, try to have at least 8 ounces come from seafood. This includes salmon, trout, herring, and anchovies. ?Dairy ?Aim to eat 3 cup-equivalents of fat-free or low-fat dairy each day. Examples of 1 cup-equivalent of dairy include 1 cup (240 mL) milk, 8 ounces (250 g) yogurt, 1? ounces (44 g) natural cheese, or 1 cup (240 mL) fortified soy milk. ?Fats and oils ?Aim for about 5 teaspoons (21 g) per day. Choose monounsaturated fats, such as canola and olive oils, avocados, peanut butter, and most nuts, or polyunsaturated fats, such as sunflower, corn, and soybean oils, walnuts, pine nuts, sesame seeds, sunflower seeds, and flaxseed. ?Beverages ?Aim for six 8-oz glasses of water per day. Limit coffee to three to five 8-oz cups per day. ?Limit caffeinated beverages that have added calories, such as soda and energy drinks. ?Limit alcohol intake to no more than 1 drink a day for nonpregnant women and 2 drinks a day for men. One drink equals 12 oz of beer (355 mL), 5 oz of wine (148 mL), or 1? oz of hard liquor (44 mL). ?Seasoning and other foods ?Avoid adding excess amounts of salt to your foods. Try flavoring foods with herbs and spices instead of salt. ?Avoid adding sugar to foods. ?Try using oil-based dressings, sauces, and spreads instead of solid fats. ?This information is based on general [place] nutrition guidelines. For more information, visit [employer]. Exact amounts may vary based on your nutrition  needs. ?Summary ?A healthy eating plan may help you to maintain a healthy weight, reduce the risk of chronic diseases, and stay active throughout your life. ?Plan your meals. Make sure you eat the right portions of a variety of nutrient-rich foods. ?Try baking, boiling, grilling, or broiling instead of frying. ?Choose healthy options in all settings, including home, work, school, restaurants, or stores. ?This information is not intended to replace advice given to you by your health care provider. Make sure you discuss any questions you have with your health care provider. ?Document Revised: 05/02/2021 Document Reviewed: 05/02/2021 ?Elsevier Patient Education ? [place]. ? ?

## 2021-12-08 ENCOUNTER — Other Ambulatory Visit: Payer: Self-pay

## 2021-12-08 ENCOUNTER — Ambulatory Visit (INDEPENDENT_AMBULATORY_CARE_PROVIDER_SITE_OTHER): Payer: 59 | Admitting: Nurse Practitioner

## 2021-12-08 ENCOUNTER — Encounter: Payer: Self-pay | Admitting: Nurse Practitioner

## 2021-12-08 VITALS — BP 134/83 | HR 78 | Temp 97.9°F | Wt 184.4 lb

## 2021-12-08 DIAGNOSIS — Z6831 Body mass index (BMI) 31.0-31.9, adult: Secondary | ICD-10-CM

## 2021-12-08 DIAGNOSIS — I10 Essential (primary) hypertension: Secondary | ICD-10-CM | POA: Diagnosis not present

## 2021-12-08 DIAGNOSIS — F325 Major depressive disorder, single episode, in full remission: Secondary | ICD-10-CM

## 2021-12-08 DIAGNOSIS — E559 Vitamin D deficiency, unspecified: Secondary | ICD-10-CM | POA: Diagnosis not present

## 2021-12-08 DIAGNOSIS — E782 Mixed hyperlipidemia: Secondary | ICD-10-CM | POA: Diagnosis not present

## 2021-12-08 DIAGNOSIS — E6609 Other obesity due to excess calories: Secondary | ICD-10-CM

## 2021-12-08 MED ORDER — HYDROCHLOROTHIAZIDE 25 MG PO TABS: 25.0000 mg | ORAL_TABLET | Freq: Every day | ORAL | 4 refills | Status: DC

## 2021-12-08 NOTE — Assessment & Plan Note (Signed)
Ongoing, will recheck Vit D level today.  Recommend to continue supplement daily. 

## 2021-12-08 NOTE — Assessment & Plan Note (Addendum)
Chronic, stable with BP at goal, although slightly more elevated then previous readings with reduction in HCTZ and noticing some fluid retention.  Will try to return to 25 MG HCTZ and plan to recheck K+ in 4 weeks, if lower level may need to do alternated days 12.5 MG every other day.  Continue Losartan at current dosing.  Check potassium today + TSH & CBC.  Recommend occasional BP checks at home and documenting these for provider.  DASH diet focus.  Return in 6 months and then 4 weeks for lab only visit. ?

## 2021-12-08 NOTE — Progress Notes (Signed)
? ?BP 134/83   Pulse 78   Temp 97.9 ?F (36.6 ?C)   Wt 184 lb 6.4 oz (83.6 kg)   SpO2 99%   BMI 33.19 kg/m?   ? ?Subjective:  ? ? Patient ID: Julia Campbell, female    DOB: [birth year], 46 y.o.   MRN: [id number] ? ?HPI: ?Julia Campbell is a 46 y.o. female ? ?Chief Complaint  ?Patient presents with  ? Hypertension  ? Hyperlipidemia  ? Depression  ? Obesity  ?  Patient states she has stopped Ozempic  ? ?HYPERTENSION / HYPERLIPIDEMIA ?Currently on Losartan, HCTZ, and Lopid (only takes once a day).  Last labs her potassium was a low and we decreased her HCTZ to 12.5 MG daily -- she is noting more fluid retention at end of day with reduction in hands and feet. ? ?She started on Phentermine & Topamax with OB/GYN in mid-December.  Tried Ozempic and she has currently stopped this, as reports this did not offer benefit -- did not have consistent loss of appetite.  Goal weight is 140 lbs. ?Satisfied with current treatment? yes ?Duration of hypertension: chronic ?BP monitoring frequency: not checking ?BP range:  ?BP medication side effects: no ?Duration of hyperlipidemia: chronic ?Cholesterol medication side effects: no ?Cholesterol supplements: none ?Medication compliance: good compliance ?Aspirin: no ?Recent stressors: no ?Recurrent headaches: no ?Visual changes: no ?Palpitations: no ?Dyspnea: no ?Chest pain: no ?Lower extremity edema: no ?Dizzy/lightheaded: no  ?The 10-year ASCVD risk score (Arnett DK, et al., 2019) is: 1.9% ?  Values used to calculate the score: ?    Age: 15 years ?    Sex: Female ?    Is Non-Hispanic African American: No ?    Diabetic: No ?    Tobacco smoker: No ?    Systolic Blood Pressure: 144 mmHg ?    Is BP treated: Yes ?    HDL Cholesterol: 57 mg/dL ?    Total Cholesterol: 235 mg/dL  ? ?DEPRESSION ?No current medications. ?Mood status: stable ?Satisfied with current treatment?: yes ?Symptom severity: mild  ?Previous psychiatric medications: Wellbutrin ?Depressed mood: no ?Anxious mood:  yes ?Anhedonia: no ?Significant weight loss or gain: no ?Insomnia: none ?Fatigue: no ?Feelings of worthlessness or guilt: no ?Impaired concentration/indecisiveness: no ?Suicidal ideations: no ?Hopelessness: no ?Crying spells: no ? ?  12/08/2021  ?  8:08 AM 05/02/2021  ?  8:41 AM 02/14/2021  ?  3:08 PM 11/01/2020  ?  8:18 AM 04/29/2020  ?  8:55 AM  ?Depression screen PHQ 2/9  ?Decreased Interest 0 0  1 0  ?Down, Depressed, Hopeless 0 0 0 1 0  ?PHQ - 2 Score 0 0 0 2 0  ?Altered sleeping 0 0  0 0  ?Tired, decreased energy 0 0  0 0  ?Change in appetite 0 0  0 0  ?Feeling bad or failure about yourself  0 0  1 0  ?Trouble concentrating 0 0  0 0  ?Moving slowly or fidgety/restless 0 0  0 0  ?Suicidal thoughts 0 0  0 0  ?PHQ-9 Score 0 0  3 0  ?Difficult doing work/chores  Not difficult at all   Not difficult at all  ?  ? ?Relevant past medical, surgical, family and social history reviewed and updated as indicated. Interim medical history since our last visit reviewed. ?Allergies and medications reviewed and updated. ? ?Review of Systems  ?Constitutional:  Negative for activity change, appetite change, diaphoresis, fatigue and fever.  ?Respiratory:  Negative for cough, chest  tightness and shortness of breath.   ?Cardiovascular:  Negative for chest pain, palpitations and leg swelling.  ?Gastrointestinal: Negative.   ?Endocrine: Negative.   ?Neurological: Negative.   ?Psychiatric/Behavioral: Negative.    ? ?Per HPI unless specifically indicated above ? ?   ?Objective:  ?  ?BP 134/83   Pulse 78   Temp 97.9 ?F (36.6 ?C)   Wt 184 lb 6.4 oz (83.6 kg)   SpO2 99%   BMI 33.19 kg/m?   ?Wt Readings from Last 3 Encounters:  ?12/08/21 184 lb 6.4 oz (83.6 kg)  ?08/07/21 177 lb (80.3 kg)  ?06/26/21 175 lb 6.4 oz (79.6 kg)  ?  ?Physical Exam ?Vitals and nursing note reviewed.  ?Constitutional:   ?   General: She is awake. She is not in acute distress. ?   Appearance: She is well-developed and well-groomed. She is obese. She is not  ill-appearing or toxic-appearing.  ?HENT:  ?   Head: Normocephalic.  ?   Right Ear: Hearing normal.  ?   Left Ear: Hearing normal.  ?Eyes:  ?   General: Lids are normal.     ?   Right eye: No discharge.     ?   Left eye: No discharge.  ?   Conjunctiva/sclera: Conjunctivae normal.  ?   Pupils: Pupils are equal, round, and reactive to light.  ?Neck:  ?   Thyroid: No thyromegaly.  ?   Vascular: No carotid bruit.  ?Cardiovascular:  ?   Rate and Rhythm: Normal rate and regular rhythm.  ?   Heart sounds: Normal heart sounds. No murmur heard. ?  No gallop.  ?Pulmonary:  ?   Effort: Pulmonary effort is normal. No accessory muscle usage or respiratory distress.  ?   Breath sounds: Normal breath sounds.  ?Abdominal:  ?   General: Bowel sounds are normal.  ?   Palpations: Abdomen is soft.  ?Musculoskeletal:  ?   Cervical back: Normal range of motion and neck supple.  ?   Right lower leg: No edema.  ?   Left lower leg: No edema.  ?Skin: ?   General: Skin is warm and dry.  ?Neurological:  ?   Mental Status: She is alert and oriented to person, place, and time.  ?Psychiatric:     ?   Attention and Perception: Attention normal.     ?   Mood and Affect: Mood normal.     ?   Behavior: Behavior normal. Behavior is cooperative.     ?   Thought Content: Thought content normal.     ?   Judgment: Judgment normal.  ? ?Results for orders placed or performed in visit on 08/07/21  ?Cytology - PAP  ?Result Value Ref Range  ? Adequacy    ?  Satisfactory for evaluation; transformation zone component PRESENT.  ? Diagnosis    ?  - Negative for intraepithelial lesion or malignancy (NILM)  ? ?   ?Assessment & Plan:  ? ?Problem List Items Addressed This Visit   ? ?  ? Cardiovascular and Mediastinum  ? Essential hypertension  ?  Chronic, stable with BP at goal, although slightly more elevated then previous readings with reduction in HCTZ and noticing some fluid retention.  Will try to return to 25 MG HCTZ and plan to recheck K+ in 4 weeks, if lower  level may need to do alternated days 12.5 MG every other day.  Continue Losartan at current dosing.  Check potassium today + TSH &  CBC.  Recommend occasional BP checks at home and documenting these for provider.  DASH diet focus.  Return in 6 months and then 4 weeks for lab only visit. ?  ?  ? Relevant Medications  ? hydrochlorothiazide (HYDRODIURIL) 25 MG tablet  ? Other Relevant Orders  ? CBC with Differential/Platelet  ? Comprehensive metabolic panel  ? TSH  ?  ? Other  ? Depression, major, single episode, complete remission ([place]) - Primary  ?  Chronic, ongoing stable without medication at this time.  Denies SI/HI.  Restart Wellbutrin as needed in future.  PHQ9 - 0 today.  Return in 6 months. ?  ?  ? Hyperlipidemia  ?  Chronic, ongoing.  Continue current medication regimen and adjust as needed.  Lipid panel today. Return in 6 months. ?  ?  ? Relevant Medications  ? hydrochlorothiazide (HYDRODIURIL) 25 MG tablet  ? Other Relevant Orders  ? Comprehensive metabolic panel  ? Lipid Panel w/o Chol/HDL Ratio  ? Obesity  ?  BMI 33.19 with goal of weight loss -- she did not see loss with Ozempic and stopped this.  Recommended eating smaller high protein, low fat meals more frequently and exercising 30 mins a day 5 times a week with a goal of 10-15lb weight loss in the next 3 months. Patient voiced their understanding and motivation to adhere to these recommendations. ? ?  ?  ? Vitamin D deficiency  ?  Ongoing, will recheck Vit D level today.  Recommend to continue supplement daily. ?  ?  ? Relevant Orders  ? VITAMIN D 25 Hydroxy (Vit-D Deficiency, Fractures)  ?  ? ?Follow up plan: ?Return in about 6 months (around 06/10/2022) for HTN/HLD - MOOD + needs 4 week lab only visit please.  ?

## 2021-12-08 NOTE — Assessment & Plan Note (Signed)
Chronic, ongoing.  Continue current medication regimen and adjust as needed.  Lipid panel today.  Return in 6 months. 

## 2021-12-08 NOTE — Assessment & Plan Note (Signed)
Chronic, ongoing stable without medication at this time.  Denies SI/HI.  Restart Wellbutrin as needed in future.  PHQ9 - 0 today.  Return in 6 months. ?

## 2021-12-08 NOTE — Assessment & Plan Note (Signed)
BMI 33.19 with goal of weight loss -- she did not see loss with Ozempic and stopped this.  Recommended eating smaller high protein, low fat meals more frequently and exercising 30 mins a day 5 times a week with a goal of 10-15lb weight loss in the next 3 months. Patient voiced their understanding and motivation to adhere to these recommendations. ? ?

## 2021-12-09 LAB — COMPREHENSIVE METABOLIC PANEL
ALT: 12 IU/L (ref 0–32)
AST: 15 IU/L (ref 0–40)
Albumin/Globulin Ratio: 1.9 (ref 1.2–2.2)
Albumin: 4.3 g/dL (ref 3.8–4.8)
Alkaline Phosphatase: 60 IU/L (ref 44–121)
BUN/Creatinine Ratio: 19 (ref 9–23)
BUN: 13 mg/dL (ref 6–24)
Bilirubin Total: 0.3 mg/dL (ref 0.0–1.2)
CO2: 22 mmol/L (ref 20–29)
Calcium: 9.4 mg/dL (ref 8.7–10.2)
Chloride: 101 mmol/L (ref 96–106)
Creatinine, Ser: 0.7 mg/dL (ref 0.57–1.00)
Globulin, Total: 2.3 g/dL (ref 1.5–4.5)
Glucose: 78 mg/dL (ref 70–99)
Potassium: 4.2 mmol/L (ref 3.5–5.2)
Sodium: 139 mmol/L (ref 134–144)
Total Protein: 6.6 g/dL (ref 6.0–8.5)
eGFR: 107 mL/min/{1.73_m2} (ref >59)

## 2021-12-09 LAB — CBC WITH DIFFERENTIAL/PLATELET
Basophils Absolute: 0.1 10*3/uL (ref 0.0–0.2)
Basos: 1 %
EOS (ABSOLUTE): 0.2 10*3/uL (ref 0.0–0.4)
Eos: 2 %
Hematocrit: 38.4 % (ref 34.0–46.6)
Hemoglobin: 12.5 g/dL (ref 11.1–15.9)
Immature Grans (Abs): 0 10*3/uL (ref 0.0–0.1)
Immature Granulocytes: 0 %
Lymphocytes Absolute: 3.2 10*3/uL — ABNORMAL HIGH (ref 0.7–3.1)
Lymphs: 36 %
MCH: 29.8 pg (ref 26.6–33.0)
MCHC: 32.6 g/dL (ref 31.5–35.7)
MCV: 92 fL (ref 79–97)
Monocytes Absolute: 0.5 10*3/uL (ref 0.1–0.9)
Monocytes: 6 %
Neutrophils Absolute: 4.9 10*3/uL (ref 1.4–7.0)
Neutrophils: 55 %
Platelets: 437 10*3/uL (ref 150–450)
RBC: 4.19 x10E6/uL (ref 3.77–5.28)
RDW: 13.5 % (ref 11.7–15.4)
WBC: 8.9 10*3/uL (ref 3.4–10.8)

## 2021-12-09 LAB — LIPID PANEL W/O CHOL/HDL RATIO
Cholesterol, Total: 208 mg/dL — ABNORMAL HIGH (ref 100–199)
HDL: 44 mg/dL (ref >39)
LDL Chol Calc (NIH): 133 mg/dL — ABNORMAL HIGH (ref 0–99)
Triglycerides: 173 mg/dL — ABNORMAL HIGH (ref 0–149)
VLDL Cholesterol Cal: 31 mg/dL (ref 5–40)

## 2021-12-09 LAB — VITAMIN D 25 HYDROXY (VIT D DEFICIENCY, FRACTURES): Vit D, 25-Hydroxy: 22.8 ng/mL — ABNORMAL LOW (ref 30.0–100.0)

## 2021-12-09 LAB — TSH: TSH: 1.99 u[IU]/mL (ref 0.450–4.500)

## 2021-12-09 NOTE — Progress Notes (Signed)
Contacted via [place] ? ? ?Good evening Julia Campbell, your labs have returned: ?- Kidney function, creatinine and eGFR, remains normal, as is liver function, AST and ALT.   ?- CBC shows no anemia or infection.  Thyroid lab is normal, TSH. ?- Vitamin D remains a little on low side, please ensure to take supplement daily. ?- Cholesterol labs remain at baseline for you, continue Lopid daily.  Any questions? ?Keep being amazing!!  Thank you for allowing me to participate in your care.  I appreciate you. ?Kindest regards, ?Kiosha Buchan ?

## 2021-12-12 ENCOUNTER — Ambulatory Visit
Admission: RE | Admit: 2021-12-12 | Discharge: 2021-12-12 | Disposition: A | Discharge status: Home / Self Care | Payer: 59 | Source: Ambulatory Visit | Attending: Obstetrics & Gynecology | Admitting: Obstetrics & Gynecology

## 2021-12-12 ENCOUNTER — Other Ambulatory Visit: Payer: Self-pay

## 2021-12-12 DIAGNOSIS — N63 Unspecified lump in unspecified breast: Secondary | ICD-10-CM | POA: Insufficient documentation

## 2021-12-12 DIAGNOSIS — R928 Other abnormal and inconclusive findings on diagnostic imaging of breast: Secondary | ICD-10-CM | POA: Diagnosis not present

## 2022-01-05 ENCOUNTER — Other Ambulatory Visit: Payer: 59

## 2022-01-05 DIAGNOSIS — E559 Vitamin D deficiency, unspecified: Secondary | ICD-10-CM

## 2022-01-06 LAB — VITAMIN D 25 HYDROXY (VIT D DEFICIENCY, FRACTURES): Vit D, 25-Hydroxy: 21.2 ng/mL — ABNORMAL LOW (ref 30.0–100.0)

## 2022-01-06 NOTE — Progress Notes (Signed)
Contacted via [place] ? ? ?Good evening Julia Campbell, your Vitamin D level remains low.  How much supplement are you taking daily?  I may switch you to a higher dose weekly, dependent on what you are currently taking.  Let me know.  Have a great weekend!! ?Keep being stellar!!  Thank you for allowing me to participate in your care.  I appreciate you. ?Kindest regards, ?Karin Pinedo ?

## 2022-01-08 ENCOUNTER — Encounter: Payer: Self-pay | Admitting: Nurse Practitioner

## 2022-01-15 ENCOUNTER — Encounter: Payer: Self-pay | Admitting: Nurse Practitioner

## 2022-02-23 ENCOUNTER — Encounter: Payer: Self-pay | Admitting: Nurse Practitioner

## 2022-02-23 DIAGNOSIS — E6609 Other obesity due to excess calories: Secondary | ICD-10-CM

## 2022-04-25 ENCOUNTER — Encounter (INDEPENDENT_AMBULATORY_CARE_PROVIDER_SITE_OTHER): Payer: Self-pay

## 2022-05-22 ENCOUNTER — Encounter: Payer: Self-pay | Admitting: Nurse Practitioner

## 2022-06-02 ENCOUNTER — Other Ambulatory Visit: Payer: Self-pay | Admitting: Nurse Practitioner

## 2022-06-04 NOTE — Telephone Encounter (Signed)
Requested Prescriptions  Pending Prescriptions Disp Refills  . losartan (COZAAR) 25 MG tablet [Pharmacy Med Name: LOSARTAN POTASSIUM 25 MG TAB] 30 tablet 14    Sig: TAKE 1 TABLET (25 MG TOTAL) BY MOUTH DAILY.     Cardiovascular:  Angiotensin Receptor Blockers Passed - 06/02/2022  1:15 AM      Passed - Cr in normal range and within 180 days    Creatinine, Ser  Date Value Ref Range Status  12/08/2021 0.70 0.57 - 1.00 mg/dL Final         Passed - K in normal range and within 180 days    Potassium  Date Value Ref Range Status  12/08/2021 4.2 3.5 - 5.2 mmol/L Final         Passed - Patient is not pregnant      Passed - Last BP in normal range    BP Readings from Last 1 Encounters:  12/08/21 134/83         Passed - Valid encounter within last 6 months    Recent Outpatient Visits          5 months ago Depression, major, single episode, complete remission ([place])   [place], Barbaraann Faster, NP   11 months ago Essential hypertension   [place], Racine T, NP   1 year ago Depression, major, single episode, complete remission ([place])   [place], Barbaraann Faster, NP   1 year ago COVID-19   [place], Lauren A, NP   1 year ago Depression, major, single episode, complete remission ([place])   [place], Barbaraann Faster, NP      Future Appointments            In 1 week [place], Barbaraann Faster, NP [facility]

## 2022-06-05 ENCOUNTER — Other Ambulatory Visit: Payer: Self-pay | Admitting: Nurse Practitioner

## 2022-06-05 ENCOUNTER — Encounter: Payer: Self-pay | Admitting: Nurse Practitioner

## 2022-06-05 NOTE — Telephone Encounter (Signed)
Medication refill for Losartan '25mg'$  last ov 12/08/21, upcoming ov 06/11/22 . Please advise

## 2022-06-09 NOTE — Patient Instructions (Signed)

## 2022-06-11 ENCOUNTER — Ambulatory Visit: Payer: PRIVATE HEALTH INSURANCE | Admitting: Nurse Practitioner

## 2022-06-11 ENCOUNTER — Encounter: Payer: Self-pay | Admitting: Nurse Practitioner

## 2022-06-11 VITALS — BP 133/81 | HR 85 | Temp 98.1°F | Ht 62.5 in | Wt 189.1 lb

## 2022-06-11 DIAGNOSIS — E559 Vitamin D deficiency, unspecified: Secondary | ICD-10-CM

## 2022-06-11 DIAGNOSIS — E6609 Other obesity due to excess calories: Secondary | ICD-10-CM

## 2022-06-11 DIAGNOSIS — Z6831 Body mass index (BMI) 31.0-31.9, adult: Secondary | ICD-10-CM

## 2022-06-11 DIAGNOSIS — F325 Major depressive disorder, single episode, in full remission: Secondary | ICD-10-CM

## 2022-06-11 DIAGNOSIS — E782 Mixed hyperlipidemia: Secondary | ICD-10-CM | POA: Diagnosis not present

## 2022-06-11 DIAGNOSIS — Z23 Encounter for immunization: Secondary | ICD-10-CM

## 2022-06-11 DIAGNOSIS — I10 Essential (primary) hypertension: Secondary | ICD-10-CM | POA: Diagnosis not present

## 2022-06-11 NOTE — Progress Notes (Signed)
BP 133/81   Pulse 85   Temp 98.1 F (36.7 C) (Oral)   Ht 5' 2.5" (1.588 m)   Wt 189 lb 1.6 oz (85.8 kg)   SpO2 98%   BMI 34.04 kg/m    Subjective:    Patient ID: Julia Campbell, female    DOB: Nov 13, 1975, 46 y.o.   MRN: [id number]  HPI: Julia Campbell is a 46 y.o. female  Chief Complaint  Patient presents with   Hyperlipidemia   Hypertension   HYPERTENSION / HYPERLIPIDEMIA Currently on HCTZ and Losartan + Lopid (only takes once a day).  Seeing Dr. Ouida Sills for weight loss and started Metformin (goal is 500 MG BID) & Phentermine for weight loss -- is noticing decrease in appetite and increase in energy at this time.  Has been taking Vitamin D3 supplement when remembers for low levels.   Satisfied with current treatment? yes Duration of hypertension: chronic BP monitoring frequency: not checking BP range:  BP medication side effects: no Duration of hyperlipidemia: chronic Cholesterol medication side effects: no Cholesterol supplements: none Medication compliance: good compliance Aspirin: no Recent stressors: no Recurrent headaches: no Visual changes: no Palpitations: no Dyspnea: no Chest pain: no Lower extremity edema: no Dizzy/lightheaded: no  The 10-year ASCVD risk score (Arnett DK, et al., 2019) is: 2.2%   Values used to calculate the score:     Age: 28 years     Sex: Female     Is Non-Hispanic African American: No     Diabetic: No     Tobacco smoker: No     Systolic Blood Pressure: 338 mmHg     Is BP treated: Yes     HDL Cholesterol: 44 mg/dL     Total Cholesterol: 208 mg/dL  Relevant past medical, surgical, family and social history reviewed and updated as indicated. Interim medical history since our last visit reviewed. Allergies and medications reviewed and updated.  Review of Systems  Constitutional:  Negative for activity change, appetite change, diaphoresis, fatigue and fever.  Respiratory:  Negative for cough, chest tightness and shortness of  breath.   Cardiovascular:  Negative for chest pain, palpitations and leg swelling.  Gastrointestinal: Negative.   Endocrine: Negative.   Neurological: Negative.   Psychiatric/Behavioral: Negative.      Per HPI unless specifically indicated above     Objective:    BP 133/81   Pulse 85   Temp 98.1 F (36.7 C) (Oral)   Ht 5' 2.5" (1.588 m)   Wt 189 lb 1.6 oz (85.8 kg)   SpO2 98%   BMI 34.04 kg/m   Wt Readings from Last 3 Encounters:  06/11/22 189 lb 1.6 oz (85.8 kg)  12/08/21 184 lb 6.4 oz (83.6 kg)  08/07/21 177 lb (80.3 kg)    Physical Exam Vitals and nursing note reviewed.  Constitutional:      General: She is awake. She is not in acute distress.    Appearance: She is well-developed, well-groomed and overweight. She is not ill-appearing.  HENT:     Head: Normocephalic.     Right Ear: Hearing normal.     Left Ear: Hearing normal.  Eyes:     General: Lids are normal.        Right eye: No discharge.        Left eye: No discharge.     Conjunctiva/sclera: Conjunctivae normal.     Pupils: Pupils are equal, round, and reactive to light.  Neck:     Thyroid: No thyromegaly.  Vascular: No carotid bruit.  Cardiovascular:     Rate and Rhythm: Normal rate and regular rhythm.     Heart sounds: Normal heart sounds. No murmur heard.    No gallop.  Pulmonary:     Effort: Pulmonary effort is normal. No accessory muscle usage or respiratory distress.     Breath sounds: Normal breath sounds.  Abdominal:     General: Bowel sounds are normal.     Palpations: Abdomen is soft.  Musculoskeletal:     Cervical back: Normal range of motion and neck supple.     Right lower leg: No edema.     Left lower leg: No edema.  Skin:    General: Skin is warm and dry.  Neurological:     Mental Status: She is alert and oriented to person, place, and time.  Psychiatric:        Attention and Perception: Attention normal.        Mood and Affect: Mood normal.        Behavior: Behavior normal.  Behavior is cooperative.        Thought Content: Thought content normal.        Judgment: Judgment normal.    Results for orders placed or performed in visit on 01/05/22  VITAMIN D 25 Hydroxy (Vit-D Deficiency, Fractures)  Result Value Ref Range   Vit D, 25-Hydroxy 21.2 (L) 30.0 - 100.0 ng/mL      Assessment & Plan:   Problem List Items Addressed This Visit       Cardiovascular and Mediastinum   Essential hypertension - Primary    Chronic, stable with BP at goal in office today.  Recommend she monitor BP at least a few mornings a week at home and document.  DASH diet at home.  Continue current medication regimen and adjust as needed.  Labs today: BMP.  Return in 6 months.       Relevant Orders   Basic metabolic panel     Other   Hyperlipidemia    Chronic, ongoing.  Continue current medication regimen and adjust as needed.  Lipid panel today. Return in 6 months.      Relevant Orders   Lipid Panel w/o Chol/HDL Ratio   Obesity    BMI 34.04 and being followed by Dr. Ouida Sills for weight loss.  Recommended eating smaller high protein, low fat meals more frequently and exercising 30 mins a day 5 times a week with a goal of 10-15lb weight loss in the next 3 months. Patient voiced their understanding and motivation to adhere to these recommendations.       Relevant Medications   phentermine (ADIPEX-P) 37.5 MG tablet   metFORMIN (GLUCOPHAGE) 500 MG tablet   Vitamin D deficiency    Ongoing, will recheck Vit D level today.  Recommend to continue supplement daily.      Relevant Orders   VITAMIN D 25 Hydroxy (Vit-D Deficiency, Fractures)   Other Visit Diagnoses     Flu vaccine need       Flu shot today in office.   Relevant Orders   Flu Vaccine QUAD 6+ mos PF IM (Fluarix Quad PF)        Follow up plan: Return in about 6 months (around 12/10/2022) for Annual Physical.

## 2022-06-11 NOTE — Assessment & Plan Note (Signed)
Chronic, ongoing.  Continue current medication regimen and adjust as needed.  Lipid panel today.  Return in 6 months. 

## 2022-06-11 NOTE — Assessment & Plan Note (Signed)
BMI 34.04 and being followed by Dr. Ouida Sills for weight loss.  Recommended eating smaller high protein, low fat meals more frequently and exercising 30 mins a day 5 times a week with a goal of 10-15lb weight loss in the next 3 months. Patient voiced their understanding and motivation to adhere to these recommendations.

## 2022-06-11 NOTE — Assessment & Plan Note (Signed)
Ongoing, will recheck Vit D level today.  Recommend to continue supplement daily.

## 2022-06-11 NOTE — Assessment & Plan Note (Signed)
Chronic, stable with BP at goal in office today.  Recommend she monitor BP at least a few mornings a week at home and document.  DASH diet at home.  Continue current medication regimen and adjust as needed.  Labs today: BMP.  Return in 6 months.

## 2022-06-12 LAB — LIPID PANEL W/O CHOL/HDL RATIO
Cholesterol, Total: 210 mg/dL — ABNORMAL HIGH (ref 100–199)
HDL: 45 mg/dL (ref >39)
LDL Chol Calc (NIH): 138 mg/dL — ABNORMAL HIGH (ref 0–99)
Triglycerides: 153 mg/dL — ABNORMAL HIGH (ref 0–149)
VLDL Cholesterol Cal: 27 mg/dL (ref 5–40)

## 2022-06-12 LAB — BASIC METABOLIC PANEL
BUN/Creatinine Ratio: 20 (ref 9–23)
BUN: 14 mg/dL (ref 6–24)
CO2: 23 mmol/L (ref 20–29)
Calcium: 9.4 mg/dL (ref 8.7–10.2)
Chloride: 99 mmol/L (ref 96–106)
Creatinine, Ser: 0.7 mg/dL (ref 0.57–1.00)
Glucose: 82 mg/dL (ref 70–99)
Potassium: 3.7 mmol/L (ref 3.5–5.2)
Sodium: 137 mmol/L (ref 134–144)
eGFR: 107 mL/min/{1.73_m2} (ref >59)

## 2022-06-12 LAB — VITAMIN D 25 HYDROXY (VIT D DEFICIENCY, FRACTURES): Vit D, 25-Hydroxy: 29.1 ng/mL — ABNORMAL LOW (ref 30.0–100.0)

## 2022-06-12 NOTE — Progress Notes (Signed)
Contacted via MyChart The 10-year ASCVD risk score (Arnett DK, et al., 2019) is: 2.1%   Values used to calculate the score:     Age: 46 years     Sex: Female     Is Non-Hispanic African American: No     Diabetic: No     Tobacco smoker: No     Systolic Blood Pressure: 937 mmHg     Is BP treated: Yes     HDL Cholesterol: 45 mg/dL     Total Cholesterol: 210 mg/dL   Good morning Julia Campbell, your labs have returned.  Vitamin D remains a little on low side, please ensure you are taking Vitamin D3 2000 units daily for bone and muscle health. Kidney function, creatinine and eGFR, remains normal.  Cholesterol labs remain a bit elevated, including triglycerides. For now ensure you are taking Lopid two times daily to help reduce levels.  Any questions? Keep being awesome!!  Thank you for allowing me to participate in your care.  I appreciate you. Kindest regards, Mardel Grudzien

## 2022-07-12 ENCOUNTER — Encounter: Payer: Self-pay | Admitting: Nurse Practitioner

## 2022-07-15 NOTE — Patient Instructions (Signed)
Rash, Adult  A rash is a change in the color of your skin. A rash can also change the way your skin feels. There are many different conditions and factors that can cause a rash. Follow these instructions at home: The goal of treatment is to stop the itching and keep the rash from spreading. Watch for any changes in your symptoms. Let your doctor know about them. Follow these instructions to help with your condition: Medicine Take or apply over-the-counter and prescription medicines only as told by your doctor. These may include medicines: To treat red or swollen skin (corticosteroid creams). To treat itching. To treat an allergy (oral antihistamines). To treat very bad symptoms (oral corticosteroids).  Skin care Put cool cloths (compresses) on the affected areas. Do not scratch or rub your skin. Avoid covering the rash. Make sure that the rash is exposed to air as much as possible. Managing itching and discomfort Avoid hot showers or baths. These can make itching worse. A cold shower may help. Try taking a bath with: Epsom salts. You can get these at your local pharmacy or grocery store. Follow the instructions on the package. Baking soda. Pour a small amount into the bath as told by your doctor. Colloidal oatmeal. You can get this at your local pharmacy or grocery store. Follow the instructions on the package. Try putting baking soda paste onto your skin. Stir water into baking soda until it gets like a paste. Try putting on a lotion that relieves itchiness (calamine lotion). Keep cool and out of the sun. Sweating and being hot can make itching worse. General instructions  Rest as needed. Drink enough fluid to keep your pee (urine) pale yellow. Wear loose-fitting clothing. Avoid scented soaps, detergents, and perfumes. Use gentle soaps, detergents, perfumes, and other cosmetic products. Avoid anything that causes your rash. Keep a journal to help track what causes your rash. Write  down: What you eat. What cosmetic products you use. What you drink. What you wear. This includes jewelry. Keep all follow-up visits as told by your doctor. This is important. Contact a doctor if: You sweat at night. You lose weight. You pee (urinate) more than normal. You pee less than normal, or you notice that your pee is a darker color than normal. You feel weak. You throw up (vomit). Your skin or the whites of your eyes look yellow (jaundice). Your skin: Tingles. Is numb. Your rash: Does not go away after a few days. Gets worse. You are: More thirsty than normal. More tired than normal. You have: New symptoms. Pain in your belly (abdomen). A fever. Watery poop (diarrhea). Get help right away if: You have a fever and your symptoms suddenly get worse. You start to feel mixed up (confused). You have a very bad headache or a stiff neck. You have very bad joint pains or stiffness. You have jerky movements that you cannot control (seizure). Your rash covers all or most of your body. The rash may or may not be painful. You have blisters that: Are on top of the rash. Grow larger. Grow together. Are painful. Are inside your nose or mouth. You have a rash that: Looks like purple pinprick-sized spots all over your body. Has a "bull's eye" or looks like a target. Is red and painful, causes your skin to peel, and is not from being in the sun too long. Summary A rash is a change in the color of your skin. A rash can also change the way your skin   feels. The goal of treatment is to stop the itching and keep the rash from spreading. Take or apply over-the-counter and prescription medicines only as told by your doctor. Contact a doctor if you have new symptoms or symptoms that get worse. Keep all follow-up visits as told by your doctor. This is important. This information is not intended to replace advice given to you by your health care provider. Make sure you discuss any  questions you have with your health care provider. Document Revised: 03/06/2022 Document Reviewed: 06/15/2021 Elsevier Patient Education  2023 Elsevier Inc.  

## 2022-07-17 ENCOUNTER — Encounter: Payer: Self-pay | Admitting: Nurse Practitioner

## 2022-07-17 ENCOUNTER — Ambulatory Visit: Payer: 59 | Admitting: Nurse Practitioner

## 2022-07-17 DIAGNOSIS — B354 Tinea corporis: Secondary | ICD-10-CM | POA: Diagnosis not present

## 2022-07-17 MED ORDER — NYSTATIN 100000 UNIT/GM EX OINT: 1.0000 | TOPICAL_OINTMENT | Freq: Two times a day (BID) | CUTANEOUS | 3 refills | Status: DC

## 2022-07-17 NOTE — Assessment & Plan Note (Signed)
Acute and present to inner thighs.  Will start Nystatin cream for treatment and educated on care of Tinea -- ensuring to dry well after showering or working out and monitoring skin.  Educated on treatment regimen.  To return if worsening or ongoing.

## 2022-07-17 NOTE — Progress Notes (Signed)
Acute Office Visit  Subjective:     Patient ID: Julia Campbell, female    DOB: 1976/02/05, 46 y.o.   MRN: [id number]  Chief Complaint  Patient presents with   Rash    Patient is here for Rash on Inner Leg. Patient says she would like for the provider to take a look at it. Patient says she first noticed it about a week ago. Patient says she has tried creams and says it is not itching not where as bad as it was.     Presents for rash to inner leg, both sides L>R.  Started about one week ago and appears to be healing but not 100%.    Rash This is a new problem. The current episode started 1 to 4 weeks ago. The problem has been gradually improving since onset. The affected locations include the left upper leg and right upper leg. The rash is characterized by itchiness and redness. It is unknown if there was an exposure to a precipitant. Pertinent negatives include no congestion, cough, diarrhea, fatigue, fever, joint pain, rhinorrhea, shortness of breath or vomiting. Past treatments include anti-itch cream, moisturizer and topical steroids. The treatment provided moderate relief. There is no history of allergies or asthma.   Patient is in today for rash.  Review of Systems  Constitutional:  Negative for fatigue, fever, malaise/fatigue and weight loss.  HENT:  Negative for congestion and rhinorrhea.   Respiratory:  Negative for cough, shortness of breath and wheezing.   Cardiovascular: Negative.   Gastrointestinal:  Negative for diarrhea and vomiting.  Musculoskeletal:  Negative for joint pain.  Skin:  Positive for rash.  Neurological: Negative.   Psychiatric/Behavioral: Negative.        Objective:    BP 117/76   Pulse 88   Temp 98.7 F (37.1 C) (Oral)   Ht 5' 2.5" (1.588 m)   Wt 180 lb 3.2 oz (81.7 kg)   SpO2 98%   BMI 32.43 kg/m  BP Readings from Last 3 Encounters:  07/17/22 117/76  06/11/22 133/81  12/08/21 134/83   Wt Readings from Last 3 Encounters:  07/17/22 180 lb  3.2 oz (81.7 kg)  06/11/22 189 lb 1.6 oz (85.8 kg)  12/08/21 184 lb 6.4 oz (83.6 kg)   Physical Exam Vitals and nursing note reviewed.  Constitutional:      General: She is awake. She is not in acute distress.    Appearance: She is well-developed, well-groomed and overweight. She is not ill-appearing.  HENT:     Head: Normocephalic.     Right Ear: Hearing normal.     Left Ear: Hearing normal.  Eyes:     General: Lids are normal.        Right eye: No discharge.        Left eye: No discharge.     Conjunctiva/sclera: Conjunctivae normal.     Pupils: Pupils are equal, round, and reactive to light.  Neck:     Thyroid: No thyromegaly.     Vascular: No carotid bruit.  Cardiovascular:     Rate and Rhythm: Normal rate and regular rhythm.     Heart sounds: Normal heart sounds. No murmur heard.    No gallop.  Pulmonary:     Effort: Pulmonary effort is normal. No accessory muscle usage or respiratory distress.     Breath sounds: Normal breath sounds.  Abdominal:     General: Bowel sounds are normal.     Palpations: Abdomen is soft.  Musculoskeletal:  Cervical back: Normal range of motion and neck supple.     Right lower leg: No edema.     Left lower leg: No edema.  Skin:    General: Skin is warm and dry.     Findings: Rash present. Rash is scaling.     Comments: Tinea infection to inner left thigh with x 3 small round patches of erythema to exterior and interior paler pink.  Right inner thigh with x 1 patch noted.  Neurological:     Mental Status: She is alert and oriented to person, place, and time.  Psychiatric:        Attention and Perception: Attention normal.        Mood and Affect: Mood normal.        Behavior: Behavior normal. Behavior is cooperative.        Thought Content: Thought content normal.        Judgment: Judgment normal.       Assessment & Plan:   Problem List Items Addressed This Visit       Musculoskeletal and Integument   Tinea corporis    Acute  and present to inner thighs.  Will start Nystatin cream for treatment and educated on care of Tinea -- ensuring to dry well after showering or working out and monitoring skin.  Educated on treatment regimen.  To return if worsening or ongoing.      Relevant Medications   nystatin ointment (MYCOSTATIN)    Meds ordered this encounter  Medications   nystatin ointment (MYCOSTATIN)    Sig: Apply 1 Application topically 2 (two) times daily.    Dispense:  30 g    Refill:  3    Return if symptoms worsen or fail to improve.  Venita Lick, NP

## 2022-07-25 ENCOUNTER — Encounter: Payer: Self-pay | Admitting: Nurse Practitioner

## 2022-08-12 NOTE — Progress Notes (Signed)
PCP: Venita Lick, NP   Chief Complaint  Patient presents with   Gynecologic Exam    No concerns    HPI:      Julia Campbell is a 46 y.o. [id number] whose LMP was Patient's last menstrual period was 08/04/2022 (exact date)., presents today for her annual examination.  Her menses are regular every 28-30 days, lasting 5-7 days, mod flow.  Dysmenorrhea none. She does not have intermenstrual bleeding. Hx of leio. She does not have vasomotor sx.   Sex activity: single partner, contraception - tubal ligation. She does have vaginal dryness, improved with lubricants.  Last Pap: 08/07/21  Results were: no abnormalities  Cervical Hx: 2014 LGSIL, Colpo Bx CIN I 2016 and 2017 ASCUS 2018 LGSIL 2018 Colpo Bx CIN I Cryo 11/2017 2019 Normal PAP 2020 Normal PAP 2021 Normal PAP   Last mammogram: [facility]/20/21 at  [facility]; results were normal after RT breast addl imaging; repeat in 12 months. 06/28/20 at [facility] Results were: normal--routine follow-up in 12 months There is no FH of breast cancer. There is no FH of ovarian cancer. The patient does do self-breast exams.  Colonoscopy: never; had to wait to age 89 for insurance coverage  Tobacco use: The patient denies current or previous tobacco use. Alcohol use: none No drug use Exercise: moderately active  She does get adequate calcium and Vitamin D in her diet.  Labs with PCP. Needs health form for work completed   Patient Active Problem List   Diagnosis Date Noted   Tinea corporis 07/17/2022   Vitamin D deficiency 04/29/2020   Depression, major, single episode, complete remission ([place]) 04/09/2017   Low grade squamous intraepith lesion on cytologic smear cervix (lgsil) 04/08/2017   Obesity 06/05/2016   Essential hypertension 04/04/2016   Hyperlipidemia     Past Surgical History:  Procedure Laterality Date   COLPOSCOPY     TUBAL LIGATION      Family History  Problem Relation Age of Onset   Kidney disease Mother        Stage 5    Hypertension Mother    Obesity Mother    Stroke Maternal Grandmother    Heart disease Maternal Grandmother    Hypertension Father    Throat cancer Father        late 14s   Heart attack Maternal Grandfather    Cancer Neg Hx    Diabetes Neg Hx     Social History   Socioeconomic History   Marital status: Married    Spouse name: Not on file   Number of children: 2   Years of education: Not on file   Highest education level: Not on file  Occupational History   Occupation: Mudlogger  Tobacco Use   Smoking status: Never   Smokeless tobacco: Never  Vaping Use   Vaping Use: Never used  Substance and Sexual Activity   Alcohol use: No    Alcohol/week: 0.0 standard drinks of alcohol   Drug use: No   Sexual activity: Yes    Birth control/protection: None, Surgical    Comment: tubal ligation  Other Topics Concern   Not on file  Social History Narrative   Not on file   Social Determinants of Health   Financial Resource Strain: Not on file  Food Insecurity: Not on file  Transportation Needs: Not on file  Physical Activity: Not on file  Stress: Not on file  Social Connections: Not on file  Intimate Partner Violence: Not on file  Current Outpatient Medications:    gemfibrozil (LOPID) 600 MG tablet, TAKE 1 TABLET BY MOUTH TWICE A DAY BEFORE MEALS, Disp: 60 tablet, Rfl: 17   hydrochlorothiazide (HYDRODIURIL) 25 MG tablet, Take 1 tablet (25 mg total) by mouth daily., Disp: 90 tablet, Rfl: 4   losartan (COZAAR) 25 MG tablet, TAKE 1 TABLET (25 MG TOTAL) BY MOUTH DAILY., Disp: 30 tablet, Rfl: 14   metFORMIN (GLUCOPHAGE) 500 MG tablet, Take 500 mg by mouth at bedtime., Disp: , Rfl:    phentermine (ADIPEX-P) 37.5 MG tablet, Take by mouth., Disp: , Rfl:      ROS:  Review of Systems  Constitutional:  Negative for fatigue, fever and unexpected weight change.  Respiratory:  Negative for cough, shortness of breath and wheezing.   Cardiovascular:  Negative for chest pain,  palpitations and leg swelling.  Gastrointestinal:  Negative for blood in stool, constipation, diarrhea, nausea and vomiting.  Endocrine: Negative for cold intolerance, heat intolerance and polyuria.  Genitourinary:  Negative for dyspareunia, dysuria, flank pain, frequency, genital sores, hematuria, menstrual problem, pelvic pain, urgency, vaginal bleeding, vaginal discharge and vaginal pain.  Musculoskeletal:  Negative for back pain, joint swelling and myalgias.  Skin:  Negative for rash.  Neurological:  Negative for dizziness, syncope, light-headedness, numbness and headaches.  Hematological:  Negative for adenopathy.  Psychiatric/Behavioral:  Negative for agitation, confusion, sleep disturbance and suicidal ideas. The patient is not nervous/anxious.    BREAST: No symptoms    Objective: BP 110/70   Ht 5' 2.5" (1.588 m)   Wt 175 lb (79.4 kg)   LMP 08/04/2022 (Exact Date)   BMI 31.50 kg/m    Physical Exam Constitutional:      Appearance: She is well-developed.  Genitourinary:     Vulva normal.     Right Labia: No rash, tenderness or lesions.    Left Labia: No tenderness, lesions or rash.    No vaginal discharge, erythema or tenderness.      Right Adnexa: not tender and no mass present.    Left Adnexa: not tender and no mass present.    No cervical friability or polyp.     Uterus is not enlarged or tender.  Breasts:    Right: No mass, nipple discharge, skin change or tenderness.     Left: No mass, nipple discharge, skin change or tenderness.  Neck:     Thyroid: No thyromegaly.  Cardiovascular:     Rate and Rhythm: Normal rate and regular rhythm.     Heart sounds: Normal heart sounds. No murmur heard. Pulmonary:     Effort: Pulmonary effort is normal.     Breath sounds: Normal breath sounds.  Abdominal:     Palpations: Abdomen is soft.     Tenderness: There is no abdominal tenderness. There is no guarding or rebound.  Musculoskeletal:        General: Normal range of  motion.     Cervical back: Normal range of motion.  Lymphadenopathy:     Cervical: No cervical adenopathy.  Neurological:     General: No focal deficit present.     Mental Status: She is alert and oriented to person, place, and time.     Cranial Nerves: No cranial nerve deficit.  Skin:    General: Skin is warm and dry.  Psychiatric:        Mood and Affect: Mood normal.        Behavior: Behavior normal.        Thought Content: Thought content  normal.        Judgment: Judgment normal.  Vitals reviewed.     Assessment/Plan:  Encounter for annual routine gynecological examination  Cervical cancer screening - Plan: Cytology - PAP  Screening for HPV (human papillomavirus) - Plan: Cytology - PAP  CIN I (cervical intraepithelial neoplasia I) - Plan: Cytology - PAP; repeat pap today  Intramural leiomyoma of uterus--menses WNL  Encounter for screening mammogram for malignant neoplasm of breast - Plan: MM 3D SCREEN BREAST BILATERAL; pt to schedule mammo  Screening for colon cancer--colonoscopy discussed. Pt will check with insurance to see if covered now since [employer] covered service. Will call for ref prn.  Health form completed for work         GYN counsel breast self exam, mammography screening, adequate intake of calcium and vitamin D, diet and exercise    F/U  Return in about 1 year (around 08/15/2023).  Kenai Fluegel B. Marlow Hendrie, PA-C 08/14/2022 8:39 AM

## 2022-08-14 ENCOUNTER — Other Ambulatory Visit (HOSPITAL_COMMUNITY)
Admission: RE | Admit: 2022-08-14 | Discharge: 2022-08-14 | Disposition: A | Discharge status: Home / Self Care | Payer: 59 | Source: Ambulatory Visit | Attending: Obstetrics and Gynecology | Admitting: Obstetrics and Gynecology

## 2022-08-14 ENCOUNTER — Ambulatory Visit (INDEPENDENT_AMBULATORY_CARE_PROVIDER_SITE_OTHER): Payer: 59 | Admitting: Obstetrics and Gynecology

## 2022-08-14 ENCOUNTER — Encounter: Payer: Self-pay | Admitting: Obstetrics and Gynecology

## 2022-08-14 VITALS — BP 110/70 | Ht 62.5 in | Wt 175.0 lb

## 2022-08-14 DIAGNOSIS — N870 Mild cervical dysplasia: Secondary | ICD-10-CM

## 2022-08-14 DIAGNOSIS — Z124 Encounter for screening for malignant neoplasm of cervix: Secondary | ICD-10-CM | POA: Diagnosis present

## 2022-08-14 DIAGNOSIS — Z1151 Encounter for screening for human papillomavirus (HPV): Secondary | ICD-10-CM | POA: Insufficient documentation

## 2022-08-14 DIAGNOSIS — Z01419 Encounter for gynecological examination (general) (routine) without abnormal findings: Secondary | ICD-10-CM | POA: Diagnosis not present

## 2022-08-14 DIAGNOSIS — Z1231 Encounter for screening mammogram for malignant neoplasm of breast: Secondary | ICD-10-CM

## 2022-08-14 DIAGNOSIS — D251 Intramural leiomyoma of uterus: Secondary | ICD-10-CM

## 2022-08-14 DIAGNOSIS — Z1211 Encounter for screening for malignant neoplasm of colon: Secondary | ICD-10-CM

## 2022-08-14 NOTE — Patient Instructions (Addendum)
I value your feedback and you entrusting us with your care. If you get a [facility] patient survey, I would appreciate you taking the time to let us know about your experience today. Thank you!  [facility]: [phone]      

## 2022-08-15 LAB — CYTOLOGY - PAP
Comment: NEGATIVE
Diagnosis: NEGATIVE
High risk HPV: NEGATIVE

## 2022-08-17 IMAGING — MG MM DIGITAL SCREENING BILAT W/ TOMO AND CAD
6 of 10 series · 6 of 30 positions shown · non-contrast
Comparison: Previous exam(s).

CLINICAL DATA: Screening.

EXAM:
DIGITAL SCREENING BILATERAL MAMMOGRAM WITH TOMOSYNTHESIS AND CAD
TECHNIQUE: Bilateral screening digital craniocaudal and mediolateral oblique
mammograms were obtained. Bilateral screening digital breast
tomosynthesis was performed. The images were evaluated with
computer-aided detection.

[L CC synth-2D]
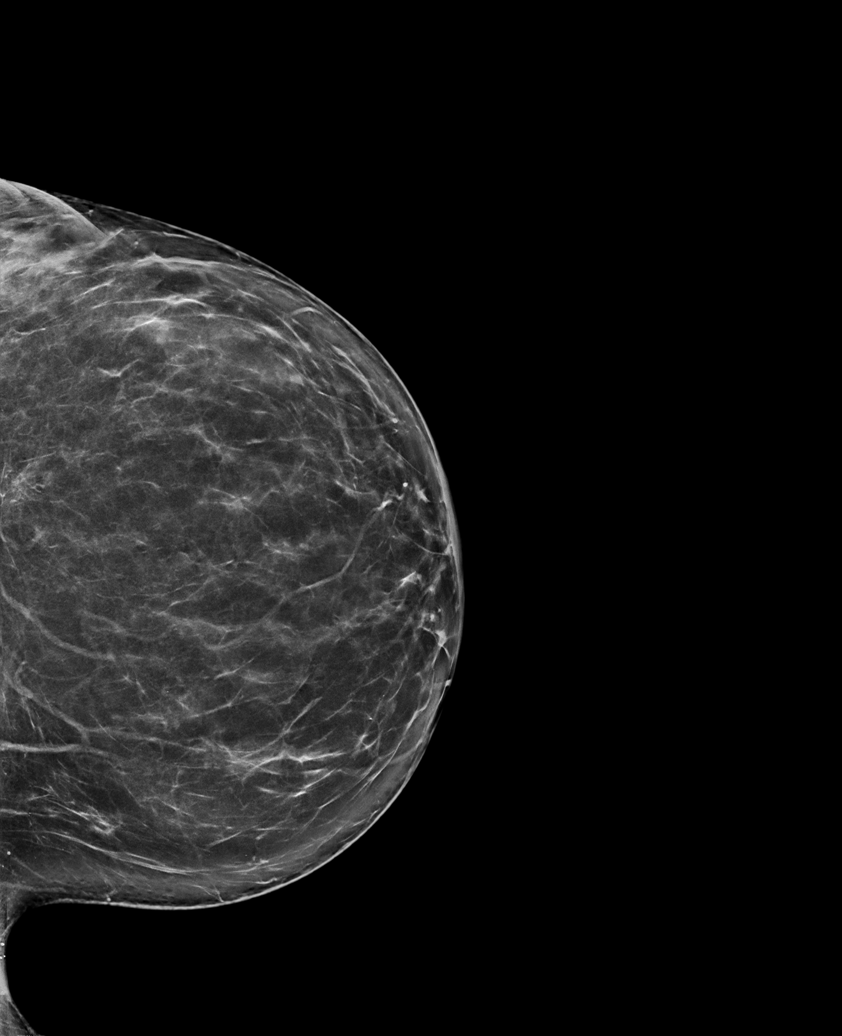

[R MLO synth-2D (1 of 2)]
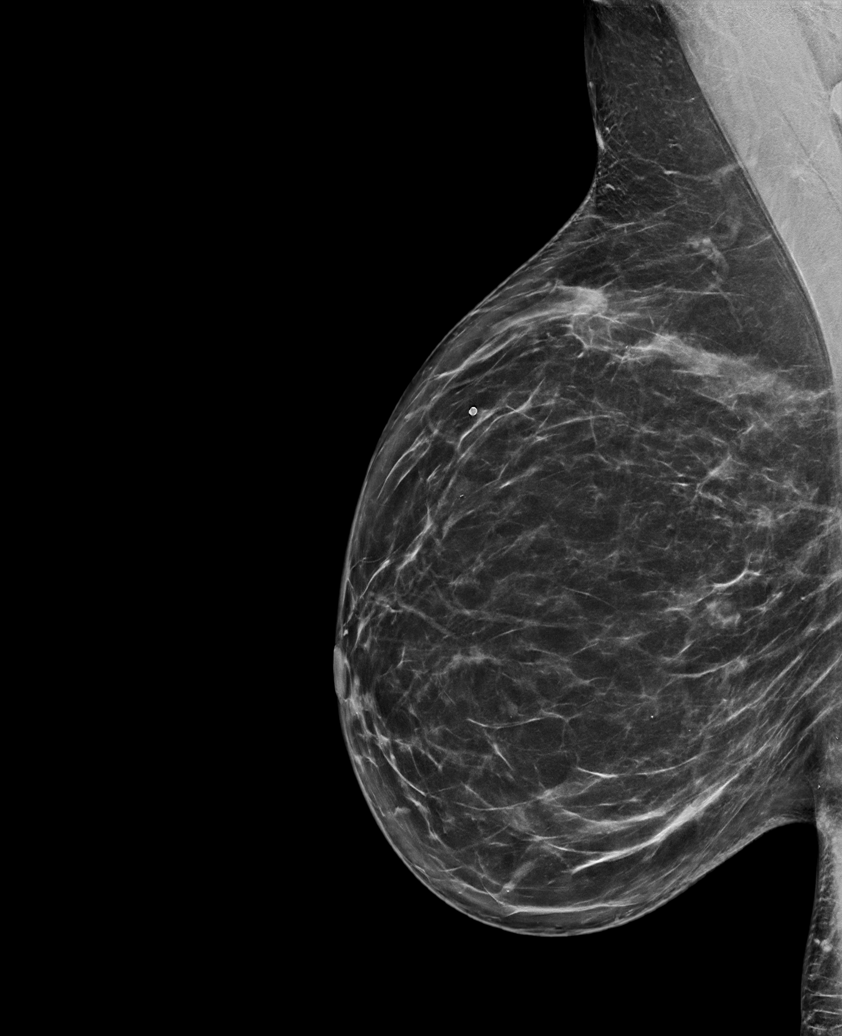

[R MLO synth-2D (2 of 2)]
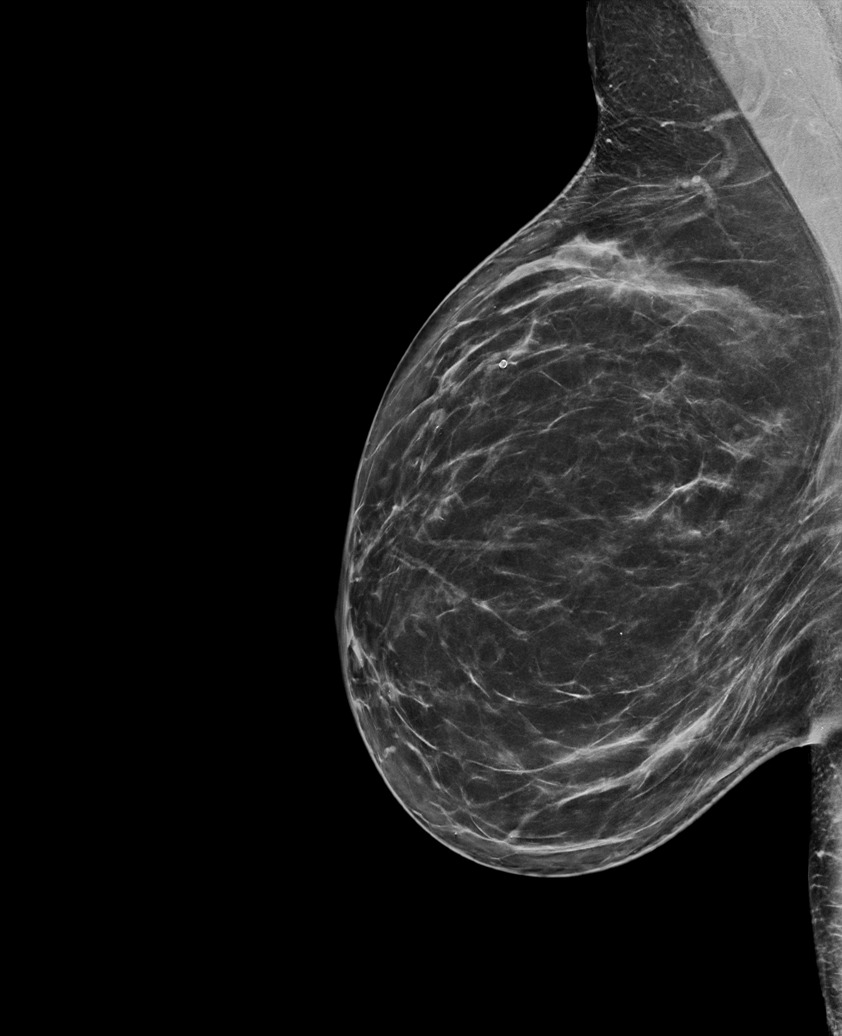

[L MLO synth-2D]
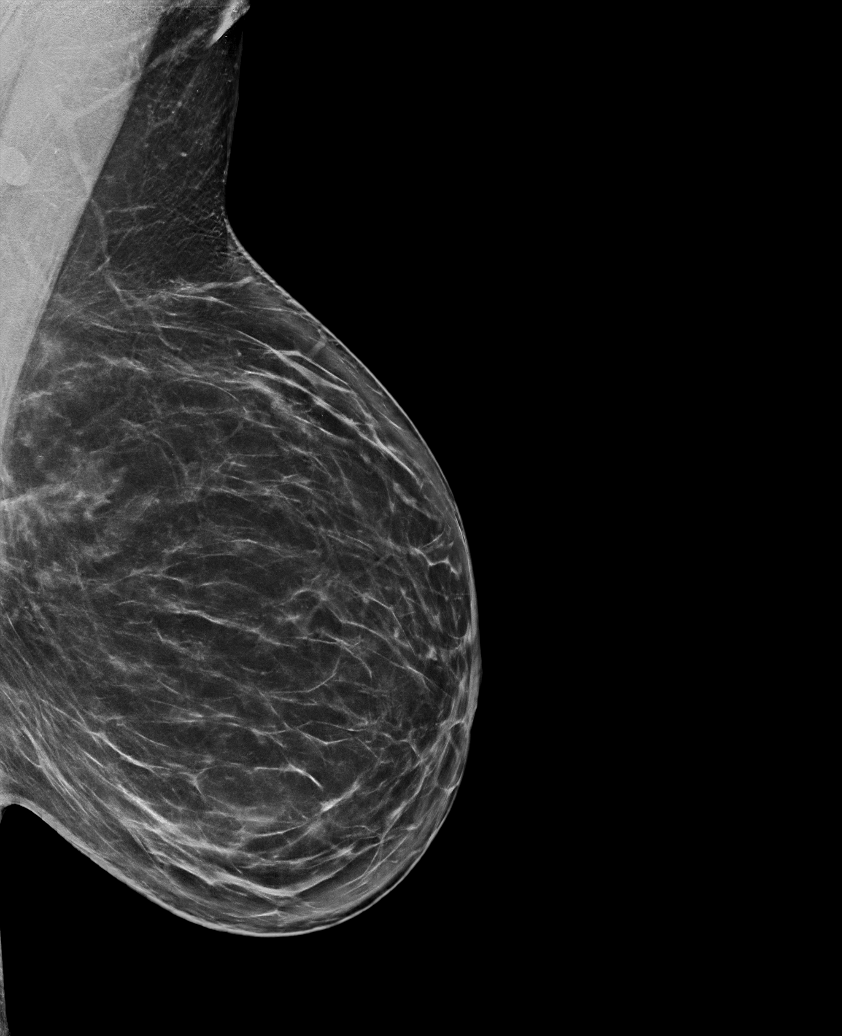

[R CC synth-2D]
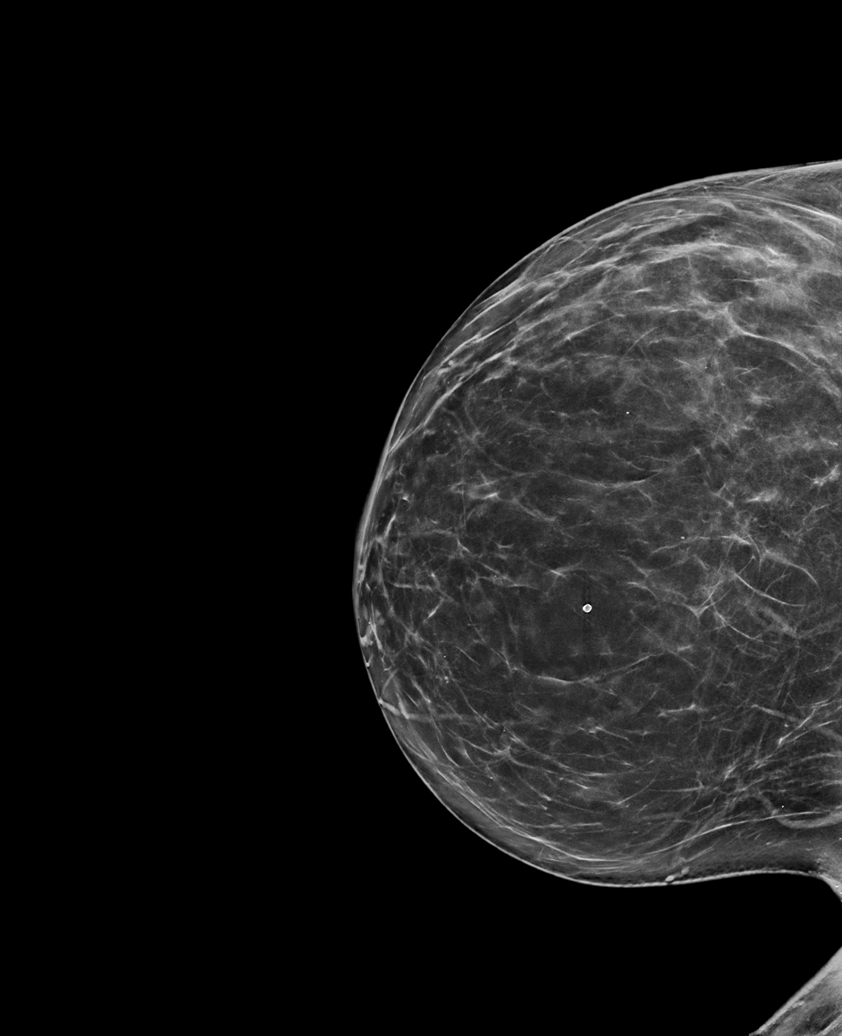

[L MLO tomo · tomo slice 43/85.0]
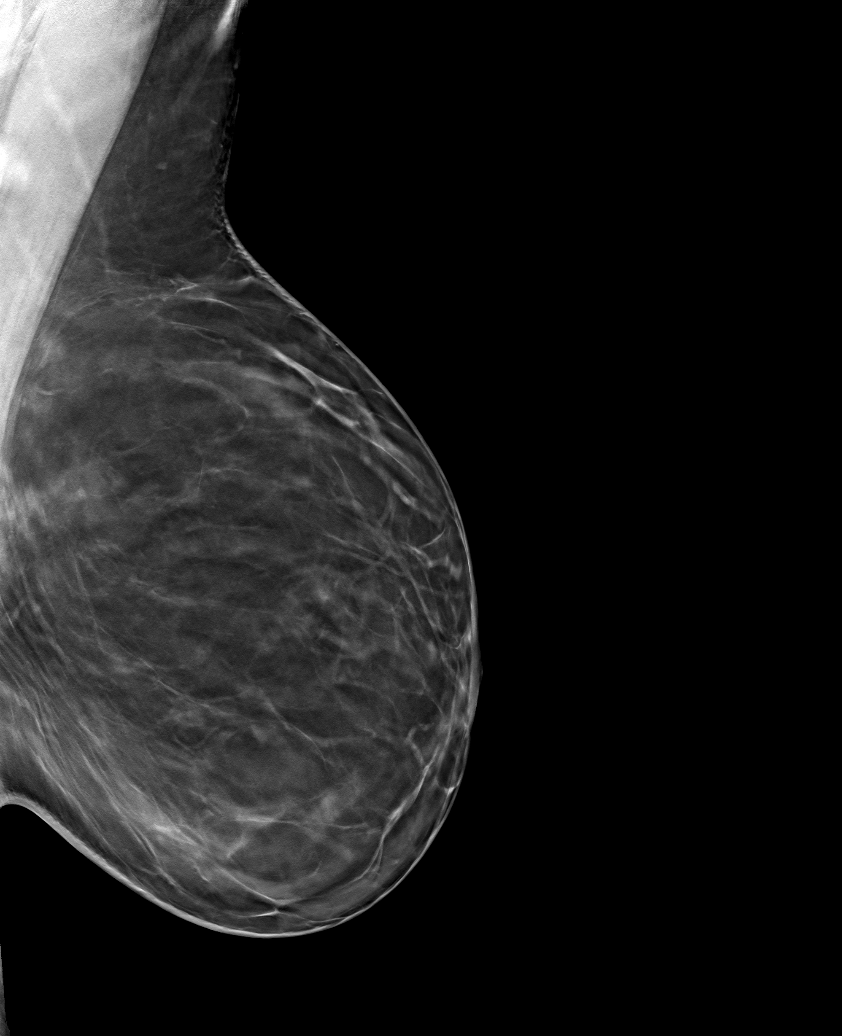

[6 of 30 positions shown; findings below may reference images not displayed]

ACR Breast Density Category c: The breast tissue is heterogeneously
dense, which may obscure small masses.
FINDINGS: In the right breast, a possible mass warrants further evaluation. In
the left breast, no findings suspicious for malignancy.
IMPRESSION: Further evaluation is suggested for a possible mass in the right
breast.

RECOMMENDATION:
Diagnostic mammogram and possibly ultrasound of the right breast.
(Code:82-G-AAO)

The patient will be contacted regarding the findings, and additional
imaging will be scheduled.

BI-RADS CATEGORY  0: Incomplete. Need additional imaging evaluation
and/or prior mammograms for comparison.

## 2022-10-09 ENCOUNTER — Encounter: Payer: Self-pay | Admitting: Nurse Practitioner

## 2022-12-01 ENCOUNTER — Other Ambulatory Visit: Payer: Self-pay | Admitting: Nurse Practitioner

## 2022-12-03 NOTE — Telephone Encounter (Signed)
Unable to refill per protocol, Rx request is too soon. Last refill 11/27/21 for 30 days and 17 refills.  Requested Prescriptions  Pending Prescriptions Disp Refills   gemfibrozil (LOPID) 600 MG tablet [Pharmacy Med Name: GEMFIBROZIL 600 MG TABLET] 60 tablet 17    Sig: TAKE 1 TABLET BY MOUTH TWICE A DAY BEFORE MEALS     Cardiovascular:  Antilipid - Fibric Acid Derivatives Failed - 12/01/2022  1:13 AM      Failed - Lipid Panel in normal range within the last 12 months    Cholesterol, Total  Date Value Ref Range Status  06/11/2022 210 (H) 100 - 199 mg/dL Final   Cholesterol Piccolo, Waived  Date Value Ref Range Status  04/04/2016 223 (H) <200 mg/dL Final    Comment:                            Desirable                <200                         Borderline High      200- 239                         High                     >239    LDL Chol Calc (NIH)  Date Value Ref Range Status  06/11/2022 138 (H) 0 - 99 mg/dL Final   HDL  Date Value Ref Range Status  06/11/2022 45 >39 mg/dL Final   Triglycerides  Date Value Ref Range Status  06/11/2022 153 (H) 0 - 149 mg/dL Final   Triglycerides Piccolo,Waived  Date Value Ref Range Status  04/04/2016 203 (H) <150 mg/dL Final    Comment:                            Normal                   <150                         Borderline High     150 - 199                         High                200 - 499                         Very High                >499          Passed - ALT in normal range and within 360 days    ALT  Date Value Ref Range Status  12/08/2021 12 0 - 32 IU/L Final         Passed - AST in normal range and within 360 days    AST  Date Value Ref Range Status  12/08/2021 15 0 - 40 IU/L Final         Passed - Cr in normal range and within 360 days    Creatinine, Ser  Date Value Ref Range Status  06/11/2022 0.70 0.57 - 1.00 mg/dL Final         Passed - HGB in normal range and within 360 days    Hemoglobin  Date  Value Ref Range Status  12/08/2021 12.5 11.1 - 15.9 g/dL Final         Passed - HCT in normal range and within 360 days    Hematocrit  Date Value Ref Range Status  12/08/2021 38.4 34.0 - 46.6 % Final         Passed - PLT in normal range and within 360 days    Platelets  Date Value Ref Range Status  12/08/2021 437 150 - 450 x10E3/uL Final         Passed - WBC in normal range and within 360 days    WBC  Date Value Ref Range Status  12/08/2021 8.9 3.4 - 10.8 x10E3/uL Final         Passed - eGFR is 30 or above and within 360 days    GFR calc Af Amer  Date Value Ref Range Status  11/01/2020 110 >59 mL/min/1.73 Final    Comment:    **In accordance with recommendations from the NKF-ASN Task force,**   [facility] is in the process of updating its eGFR calculation to the   2021 CKD-EPI creatinine equation that estimates kidney function   without a race variable.    GFR calc non Af Amer  Date Value Ref Range Status  11/01/2020 95 >59 mL/min/1.73 Final   eGFR  Date Value Ref Range Status  06/11/2022 107 >59 mL/min/1.73 Final         Passed - Valid encounter within last 12 months    Recent Outpatient Visits           4 months ago Tinea corporis   [place], [place] T, NP   5 months ago Essential hypertension   [place], [place] T, NP   12 months ago Depression, major, single episode, complete remission ([place])   [place], Henrine Screws T, NP   1 year ago Essential hypertension   [place], Henrine Screws T, NP   1 year ago Depression, major, single episode, complete remission ([place])   [place], Barbaraann Faster, NP       Future Appointments             In 1 week [place], Barbaraann Faster, NP [place], [facility]

## 2022-12-08 NOTE — Patient Instructions (Incomplete)
Please call to schedule your mammogram and/or bone density: [facility] at [facility]  Address: [address], [place], [place] [address] Phone: [phone]  [facility] [place] [address] [place],  [place]  [address] Phone: [phone]    Healthy Eating, Adult Healthy eating may help you get and keep a healthy body weight, reduce the risk of chronic disease, and live a long and productive life. It is important to follow a healthy eating pattern. Your nutritional and calorie needs should be met mainly by different nutrient-rich foods. What are tips for following this plan? Reading food labels Read labels and choose the following: Reduced or low sodium products. Juices with 100% fruit juice. Foods with low saturated fats (<3 g per serving) and high polyunsaturated and monounsaturated fats. Foods with whole grains, such as whole wheat, cracked wheat, brown rice, and wild rice. Whole grains that are fortified with folic acid. This is recommended for females who are pregnant or who want to become pregnant. Read labels and do not eat or drink the following: Foods or drinks with added sugars. These include foods that contain brown sugar, corn sweetener, corn syrup, dextrose, fructose, glucose, high-fructose corn syrup, honey, invert sugar, lactose, malt syrup, maltose, molasses, raw sugar, sucrose, trehalose, or turbinado sugar. Limit your intake of added sugars to less than 10% of your total daily calories. Do not eat more than the following amounts of added sugar per day: 6 teaspoons (25 g) for females. 9 teaspoons (38 g) for males. Foods that contain processed or refined starches and grains. Refined grain products, such as white flour, degermed cornmeal, white bread, and white rice. Shopping Choose nutrient-rich snacks, such as vegetables, whole fruits, and nuts. Avoid high-calorie and high-sugar snacks, such as potato chips,  fruit snacks, and candy. Use oil-based dressings and spreads on foods instead of solid fats such as butter, margarine, sour cream, or cream cheese. Limit pre-made sauces, mixes, and "instant" products such as flavored rice, instant noodles, and ready-made pasta. Try more plant-protein sources, such as tofu, tempeh, black beans, edamame, lentils, nuts, and seeds. Explore eating plans such as the Mediterranean diet or vegetarian diet. Try heart-healthy dips made with beans and healthy fats like hummus and guacamole. Vegetables go great with these. Cooking Use oil to saut or stir-fry foods instead of solid fats such as butter, margarine, or lard. Try baking, boiling, grilling, or broiling instead of frying. Remove the fatty part of meats before cooking. Steam vegetables in water or broth. Meal planning  At meals, imagine dividing your plate into fourths: One-half of your plate is fruits and vegetables. One-fourth of your plate is whole grains. One-fourth of your plate is protein, especially lean meats, poultry, eggs, tofu, beans, or nuts. Include low-fat dairy as part of your daily diet. Lifestyle Choose healthy options in all settings, including home, work, school, restaurants, or stores. Prepare your food safely: Wash your hands after handling raw meats. Where you prepare food, keep surfaces clean by regularly washing with hot, soapy water. Keep raw meats separate from ready-to-eat foods, such as fruits and vegetables. Cook seafood, meat, poultry, and eggs to the recommended temperature. Get a food thermometer. Store foods at safe temperatures. In general: Keep cold foods at 40F (4.4C) or below. Keep hot foods at 140F (60C) or above. Keep your freezer at 0F (-17.8C) or below. Foods are not safe to eat if they have been between the temperatures of 40-140F (4.4-60C) for more   than 2 hours. What foods should I eat? Fruits Aim to eat 1-2 cups of fresh, canned (in natural juice),  or frozen fruits each day. One cup of fruit equals 1 small apple, 1 large banana, 8 large strawberries, 1 cup (237 g) canned fruit,  cup (82 g) dried fruit, or 1 cup (240 mL) 100% juice. Vegetables Aim to eat 2-4 cups of fresh and frozen vegetables each day, including different varieties and colors. One cup of vegetables equals 1 cup (91 g) broccoli or cauliflower florets, 2 medium carrots, 2 cups (150 g) raw, leafy greens, 1 large tomato, 1 large bell pepper, 1 large sweet potato, or 1 medium white potato. Grains Aim to eat 5-10 ounce-equivalents of whole grains each day. Examples of 1 ounce-equivalent of grains include 1 slice of bread, 1 cup (40 g) ready-to-eat cereal, 3 cups (24 g) popcorn, or  cup (93 g) cooked rice. Meats and other proteins Try to eat 5-7 ounce-equivalents of protein each day. Examples of 1 ounce-equivalent of protein include 1 egg,  oz nuts (12 almonds, 24 pistachios, or 7 walnut halves), 1/4 cup (90 g) cooked beans, 6 tablespoons (90 g) hummus or 1 tablespoon (16 g) peanut butter. A cut of meat or fish that is the size of a deck of cards is about 3-4 ounce-equivalents (85 g). Of the protein you eat each week, try to have at least 8 sounce (227 g) of seafood. This is about 2 servings per week. This includes salmon, trout, herring, sardines, and anchovies. Dairy Aim to eat 3 cup-equivalents of fat-free or low-fat dairy each day. Examples of 1 cup-equivalent of dairy include 1 cup (240 mL) milk, 8 ounces (250 g) yogurt, 1 ounces (44 g) natural cheese, or 1 cup (240 mL) fortified soy milk. Fats and oils Aim for about 5 teaspoons (21 g) of fats and oils per day. Choose monounsaturated fats, such as canola and olive oils, mayonnaise made with olive oil or avocado oil, avocados, peanut butter, and most nuts, or polyunsaturated fats, such as sunflower, corn, and soybean oils, walnuts, pine nuts, sesame seeds, sunflower seeds, and flaxseed. Beverages Aim for 6 eight-ounce glasses of  water per day. Limit coffee to 3-5 eight-ounce cups per day. Limit caffeinated beverages that have added calories, such as soda and energy drinks. If you drink alcohol: Limit how much you have to: 0-1 drink a day if you are female. 0-2 drinks a day if you are female. Know how much alcohol is in your drink. In the [place] one drink is one 12 oz bottle of beer (355 mL), one 5 oz glass of wine (148 mL), or one 1 oz glass of hard liquor (44 mL). Seasoning and other foods Try not to add too much salt to your food. Try using herbs and spices instead of salt. Try not to add sugar to food. This information is based on U.[place] nutrition guidelines. To learn more, visit choosemyplate.gov. Exact amounts may vary. You may need different amounts. This information is not intended to replace advice given to you by your health care provider. Make sure you discuss any questions you have with your health care provider. Document Revised: 06/04/2022 Document Reviewed: 06/04/2022 Elsevier Patient Education  2023 Elsevier Inc.  

## 2022-12-10 ENCOUNTER — Ambulatory Visit: Payer: Managed Care, Other (non HMO) | Admitting: Nurse Practitioner

## 2022-12-10 ENCOUNTER — Encounter: Payer: Self-pay | Admitting: Nurse Practitioner

## 2022-12-10 VITALS — BP 116/80 | HR 82 | Temp 98.1°F | Ht 62.52 in | Wt 177.5 lb

## 2022-12-10 DIAGNOSIS — E782 Mixed hyperlipidemia: Secondary | ICD-10-CM | POA: Diagnosis not present

## 2022-12-10 DIAGNOSIS — E6609 Other obesity due to excess calories: Secondary | ICD-10-CM

## 2022-12-10 DIAGNOSIS — I10 Essential (primary) hypertension: Secondary | ICD-10-CM | POA: Diagnosis not present

## 2022-12-10 DIAGNOSIS — F325 Major depressive disorder, single episode, in full remission: Secondary | ICD-10-CM

## 2022-12-10 DIAGNOSIS — E559 Vitamin D deficiency, unspecified: Secondary | ICD-10-CM | POA: Diagnosis not present

## 2022-12-10 DIAGNOSIS — Z0000 Encounter for general adult medical examination without abnormal findings: Secondary | ICD-10-CM

## 2022-12-10 DIAGNOSIS — Z6831 Body mass index (BMI) 31.0-31.9, adult: Secondary | ICD-10-CM

## 2022-12-10 MED ORDER — TIRZEPATIDE 2.5 MG/0.5ML ~~LOC~~ SOAJ: 2.5000 mg | SUBCUTANEOUS | 3 refills | Status: DC

## 2022-12-10 NOTE — Assessment & Plan Note (Addendum)
Chronic, stable.  BP remains at goal in office.  Recommend she monitor BP at least a few mornings a week at home and document.  DASH diet at home.  Continue current medication regimen and adjust as needed.  Labs today: CBC, CMP, TSH.  Return in 6 months.

## 2022-12-10 NOTE — Assessment & Plan Note (Signed)
BMI 31.93, will work on getting Mounjaro coverage but she is aware this will most likely not get approved for weight loss.  She would like to try.  No family history of thyroid cancer (MTC, MEN 2, thyroid cell tumors) or pancreatitis. Recommended eating smaller high protein, low fat meals more frequently and exercising 30 mins a day 5 times a week with a goal of 10-15lb weight loss in the next 3 months. Patient voiced their understanding and motivation to adhere to these recommendations.

## 2022-12-10 NOTE — Progress Notes (Signed)
BP 116/80   Pulse 82   Temp 98.1 F (36.7 C) (Oral)   Ht 5' 2.52" (1.588 m)   Wt 177 lb 8 oz (80.5 kg)   BMI 31.93 kg/m    Subjective:    Patient ID: Julia Campbell, female    DOB: March 27, 1976, 47 y.o.   MRN: [id number]  HPI: Julia Campbell is a 47 y.o. female  Chief Complaint  Patient presents with   Hypertension   Hyperlipidemia   Depression   HYPERTENSION / HYPERLIPIDEMIA Currently on Losartan, HCTZ, and Lopid (only takes once a day).    Currently taking Metformin as ordered by weight management, but not consistently taking Phentermine as not working as well.  She would like Mounjaro script sent to see if approval for this.  Goal weight is 140 lbs. Satisfied with current treatment? yes Duration of hypertension: chronic BP monitoring frequency: not checking BP range:  BP medication side effects: no Duration of hyperlipidemia: chronic Cholesterol medication side effects: no Cholesterol supplements: none Medication compliance: good compliance Aspirin: no Recent stressors: no Recurrent headaches: no Visual changes: no Palpitations: no Dyspnea: no Chest pain: no Lower extremity edema: no Dizzy/lightheaded: no  The 10-year ASCVD risk score (Arnett DK, et al., 2019) is: 1.7%   Values used to calculate the score:     Age: 72 years     Sex: Female     Is Non-Hispanic African American: No     Diabetic: No     Tobacco smoker: No     Systolic Blood Pressure: 99991111 mmHg     Is BP treated: Yes     HDL Cholesterol: 45 mg/dL     Total Cholesterol: 210 mg/dL   DEPRESSION No current medications. Mood status: stable Satisfied with current treatment?: yes Symptom severity: mild  Previous psychiatric medications: Wellbutrin Depressed mood: no Anxious mood: yes Anhedonia: no Significant weight loss or gain: no Insomnia: none Fatigue: no Feelings of worthlessness or guilt: no Impaired concentration/indecisiveness: no Suicidal ideations: no Hopelessness: no Crying  spells: no    12/10/2022    8:13 AM 07/17/2022    4:03 PM 12/08/2021    8:08 AM 05/02/2021    8:41 AM 02/14/2021    3:08 PM  Depression screen PHQ 2/9  Decreased Interest 0 0 0 0   Down, Depressed, Hopeless 0 0 0 0 0  PHQ - 2 Score 0 0 0 0 0  Altered sleeping 0 0 0 0   Tired, decreased energy 0 0 0 0   Change in appetite 1 0 0 0   Feeling bad or failure about yourself  0 0 0 0   Trouble concentrating 0 0 0 0   Moving slowly or fidgety/restless 0 0 0 0   Suicidal thoughts 0 0 0 0   PHQ-9 Score 1 0 0 0   Difficult doing work/chores Not difficult at all   Not difficult at all        12/10/2022    8:14 AM 07/17/2022    4:03 PM 12/08/2021    8:08 AM 03/09/2019    9:44 AM  GAD 7 : Generalized Anxiety Score  Nervous, Anxious, on Edge 0 0 1 1  Control/stop worrying 0 0 0 2  Worry too much - different things 0 0 1 2  Trouble relaxing 0 0 0 1  Restless 0 0 0 0  Easily annoyed or irritable 0 0 1 1  Afraid - awful might happen 0 0 1 1  Total  GAD 7 Score 0 0 4 8  Anxiety Difficulty Not difficult at all  Not difficult at all     Relevant past medical, surgical, family and social history reviewed and updated as indicated. Interim medical history since our last visit reviewed. Allergies and medications reviewed and updated.  Review of Systems  Constitutional:  Negative for activity change, appetite change, diaphoresis, fatigue and fever.  HENT:  Negative for mouth sores.   Respiratory:  Negative for cough, chest tightness and shortness of breath.   Cardiovascular:  Negative for chest pain, palpitations and leg swelling.  Gastrointestinal: Negative.   Endocrine: Negative.   Neurological: Negative.   Psychiatric/Behavioral: Negative.      Per HPI unless specifically indicated above     Objective:    BP 116/80   Pulse 82   Temp 98.1 F (36.7 C) (Oral)   Ht 5' 2.52" (1.588 m)   Wt 177 lb 8 oz (80.5 kg)   BMI 31.93 kg/m   Wt Readings from Last 3 Encounters:  12/10/22 177 lb 8  oz (80.5 kg)  08/14/22 175 lb (79.4 kg)  07/17/22 180 lb 3.2 oz (81.7 kg)    Physical Exam Vitals and nursing note reviewed.  Constitutional:      General: She is awake. She is not in acute distress.    Appearance: She is well-developed and well-groomed. She is obese. She is not ill-appearing or toxic-appearing.  HENT:     Head: Normocephalic.     Right Ear: Hearing normal.     Left Ear: Hearing normal.  Eyes:     General: Lids are normal.        Right eye: No discharge.        Left eye: No discharge.     Conjunctiva/sclera: Conjunctivae normal.     Pupils: Pupils are equal, round, and reactive to light.  Neck:     Thyroid: No thyromegaly.     Vascular: No carotid bruit.  Cardiovascular:     Rate and Rhythm: Normal rate and regular rhythm.     Heart sounds: Normal heart sounds. No murmur heard.    No gallop.  Pulmonary:     Effort: Pulmonary effort is normal. No accessory muscle usage or respiratory distress.     Breath sounds: Normal breath sounds.  Abdominal:     General: Bowel sounds are normal.     Palpations: Abdomen is soft.  Musculoskeletal:     Cervical back: Normal range of motion and neck supple.     Right lower leg: No edema.     Left lower leg: No edema.  Skin:    General: Skin is warm and dry.  Neurological:     Mental Status: She is alert and oriented to person, place, and time.  Psychiatric:        Attention and Perception: Attention normal.        Mood and Affect: Mood normal.        Behavior: Behavior normal. Behavior is cooperative.        Thought Content: Thought content normal.        Judgment: Judgment normal.    Results for orders placed or performed in visit on 08/14/22  Cytology - PAP  Result Value Ref Range   High risk HPV Negative    Adequacy      Satisfactory for evaluation; transformation zone component PRESENT.   Diagnosis      - Negative for intraepithelial lesion or malignancy (NILM)   Comment  Normal Reference Range HPV - Negative        Assessment & Plan:   Problem List Items Addressed This Visit       Cardiovascular and Mediastinum   Essential hypertension    Chronic, stable.  BP remains at goal in office.  Recommend she monitor BP at least a few mornings a week at home and document.  DASH diet at home.  Continue current medication regimen and adjust as needed.  Labs today: CBC, CMP, TSH.  Return in 6 months.       Relevant Orders   CBC with Differential/Platelet   Comprehensive metabolic panel   TSH     Other   Depression, major, single episode, complete remission ([place]) - Primary    Remains in remission, no medications.  Denies SI/HI.  Restart Wellbutrin as needed in future.  PHQ9 - 1 today.  Return in 6 months.      Hyperlipidemia    Chronic, ongoing.  Continue current medication regimen and adjust as needed.  Lipid panel today. Return in 6 months.      Relevant Orders   Comprehensive metabolic panel   Lipid Panel w/o Chol/HDL Ratio   Obesity    BMI 31.93, will work on getting Mounjaro coverage but she is aware this will most likely not get approved for weight loss.  She would like to try.  No family history of thyroid cancer (MTC, MEN 2, thyroid cell tumors) or pancreatitis. Recommended eating smaller high protein, low fat meals more frequently and exercising 30 mins a day 5 times a week with a goal of 10-15lb weight loss in the next 3 months. Patient voiced their understanding and motivation to adhere to these recommendations.       Relevant Medications   phentermine (ADIPEX-P) 37.5 MG tablet   tirzepatide (MOUNJARO) 2.5 MG/0.5ML Pen   Vitamin D deficiency    Ongoing, will recheck Vit D level today.  Recommend to continue supplement daily.      Relevant Orders   VITAMIN D 25 Hydroxy (Vit-D Deficiency, Fractures)     Follow up plan: Return in about 6 months (around 06/12/2023) for HTN/HLD, WEIGHT CHECK.

## 2022-12-10 NOTE — Assessment & Plan Note (Signed)
Ongoing, will recheck Vit D level today.  Recommend to continue supplement daily. 

## 2022-12-10 NOTE — Assessment & Plan Note (Signed)
Remains in remission, no medications.  Denies SI/HI.  Restart Wellbutrin as needed in future.  PHQ9 - 1 today.  Return in 6 months.

## 2022-12-10 NOTE — Assessment & Plan Note (Signed)
Chronic, ongoing.  Continue current medication regimen and adjust as needed.  Lipid panel today.  Return in 6 months. 

## 2022-12-11 LAB — COMPREHENSIVE METABOLIC PANEL
ALT: 13 IU/L (ref 0–32)
AST: 13 IU/L (ref 0–40)
Albumin/Globulin Ratio: 1.7 (ref 1.2–2.2)
Albumin: 4.3 g/dL (ref 3.9–4.9)
Alkaline Phosphatase: 69 IU/L (ref 44–121)
BUN/Creatinine Ratio: 19 (ref 9–23)
BUN: 13 mg/dL (ref 6–24)
Bilirubin Total: 0.3 mg/dL (ref 0.0–1.2)
CO2: 21 mmol/L (ref 20–29)
Calcium: 9.3 mg/dL (ref 8.7–10.2)
Chloride: 100 mmol/L (ref 96–106)
Creatinine, Ser: 0.69 mg/dL (ref 0.57–1.00)
Globulin, Total: 2.5 g/dL (ref 1.5–4.5)
Glucose: 85 mg/dL (ref 70–99)
Potassium: 4.2 mmol/L (ref 3.5–5.2)
Sodium: 138 mmol/L (ref 134–144)
Total Protein: 6.8 g/dL (ref 6.0–8.5)
eGFR: 106 mL/min/{1.73_m2} (ref >59)

## 2022-12-11 LAB — LIPID PANEL W/O CHOL/HDL RATIO
Cholesterol, Total: 253 mg/dL — ABNORMAL HIGH (ref 100–199)
HDL: 52 mg/dL (ref >39)
LDL Chol Calc (NIH): 147 mg/dL — ABNORMAL HIGH (ref 0–99)
Triglycerides: 298 mg/dL — ABNORMAL HIGH (ref 0–149)
VLDL Cholesterol Cal: 54 mg/dL — ABNORMAL HIGH (ref 5–40)

## 2022-12-11 LAB — CBC WITH DIFFERENTIAL/PLATELET
Basophils Absolute: 0.1 10*3/uL (ref 0.0–0.2)
Basos: 1 %
EOS (ABSOLUTE): 0.2 10*3/uL (ref 0.0–0.4)
Eos: 2 %
Hematocrit: 38.1 % (ref 34.0–46.6)
Hemoglobin: 12.6 g/dL (ref 11.1–15.9)
Immature Grans (Abs): 0 10*3/uL (ref 0.0–0.1)
Immature Granulocytes: 0 %
Lymphocytes Absolute: 3 10*3/uL (ref 0.7–3.1)
Lymphs: 30 %
MCH: 30.1 pg (ref 26.6–33.0)
MCHC: 33.1 g/dL (ref 31.5–35.7)
MCV: 91 fL (ref 79–97)
Monocytes Absolute: 0.5 10*3/uL (ref 0.1–0.9)
Monocytes: 4 %
Neutrophils Absolute: 6.5 10*3/uL (ref 1.4–7.0)
Neutrophils: 63 %
Platelets: 478 10*3/uL — ABNORMAL HIGH (ref 150–450)
RBC: 4.18 x10E6/uL (ref 3.77–5.28)
RDW: 13.3 % (ref 11.7–15.4)
WBC: 10.3 10*3/uL (ref 3.4–10.8)

## 2022-12-11 LAB — VITAMIN D 25 HYDROXY (VIT D DEFICIENCY, FRACTURES): Vit D, 25-Hydroxy: 25.8 ng/mL — ABNORMAL LOW (ref 30.0–100.0)

## 2022-12-11 LAB — TSH: TSH: 2.67 u[IU]/mL (ref 0.450–4.500)

## 2022-12-11 NOTE — Progress Notes (Signed)
Contacted via [place] afternoon [place], your labs have returned: - CBC overall stable, mild elevation in platelet count as have seen before.  We will watch closely. - Cholesterol labs remain elevated, continue current medication regimen and we may adjust in future.  Also focus heavily on diet and regular exercise.  Recommend viewing the American Heart Association which has a plethora of information on this. - Vitamin D remains low, please ensure you are taking Vitamin D3 2000 units daily which is good for bone and muscle health + they are using for weight loss too. - Kidney function, creatinine and eGFR, remains normal, as is liver function, AST and ALT.  - Thyroid lab normal, TSH.  Any questions? Keep being amazing!!  Thank you for allowing me to participate in your care.  I appreciate you. Kindest regards, Tyerra Loretto

## 2022-12-12 ENCOUNTER — Encounter: Payer: Self-pay | Admitting: Nurse Practitioner

## 2022-12-13 ENCOUNTER — Other Ambulatory Visit: Payer: Self-pay | Admitting: Nurse Practitioner

## 2022-12-13 MED ORDER — HYDROCHLOROTHIAZIDE 25 MG PO TABS: 25.0000 mg | ORAL_TABLET | Freq: Every day | ORAL | 4 refills | Status: DC

## 2022-12-13 NOTE — Telephone Encounter (Signed)
Already ordered today by provider, will refuse this request as a duplicate.  Requested Prescriptions  Pending Prescriptions Disp Refills   hydrochlorothiazide (HYDRODIURIL) 25 MG tablet [Pharmacy Med Name: HYDROCHLOROTHIAZIDE 25 MG TAB] 30 tablet 14    Sig: TAKE 1 TABLET (25 MG TOTAL) BY MOUTH DAILY.     Cardiovascular: Diuretics - Thiazide Passed - 12/13/2022  2:07 AM      Passed - Cr in normal range and within 180 days    Creatinine, Ser  Date Value Ref Range Status  12/10/2022 0.69 0.57 - 1.00 mg/dL Final         Passed - K in normal range and within 180 days    Potassium  Date Value Ref Range Status  12/10/2022 4.2 3.5 - 5.2 mmol/L Final         Passed - Na in normal range and within 180 days    Sodium  Date Value Ref Range Status  12/10/2022 138 134 - 144 mmol/L Final         Passed - Last BP in normal range    BP Readings from Last 1 Encounters:  12/10/22 116/80         Passed - Valid encounter within last 6 months    Recent Outpatient Visits           3 days ago Depression, major, single episode, complete remission ([place])   [place], Barbaraann Faster, NP   4 months ago Tinea corporis   [place] Rule, Thedford T, NP   6 months ago Essential hypertension   [place], [place] T, NP   1 year ago Depression, major, single episode, complete remission ([place])   [place], Barbaraann Faster, NP   1 year ago Essential hypertension   [place], Barbaraann Faster, NP       Future Appointments             In 6 months Cannady, Barbaraann Faster, NP [place], [facility]

## 2022-12-19 ENCOUNTER — Encounter: Payer: Self-pay | Admitting: Nurse Practitioner

## 2022-12-19 MED ORDER — TIRZEPATIDE 2.5 MG/0.5ML ~~LOC~~ SOAJ: 2.5000 mg | SUBCUTANEOUS | 3 refills | Status: DC

## 2022-12-20 ENCOUNTER — Telehealth: Payer: Self-pay | Admitting: Nurse Practitioner

## 2022-12-20 MED ORDER — TIRZEPATIDE 2.5 MG/0.5ML ~~LOC~~ SOAJ: 2.5000 mg | SUBCUTANEOUS | 3 refills | Status: DC

## 2022-12-20 NOTE — Telephone Encounter (Signed)
Medication Refill - Medication: Mounjaro  Has the patient contacted their pharmacy? Yes.   Starla from [facility] called stated the saving card is requesting if patient has a diagnosis of Type 2 diabetes they need the ICD-10 code otherwise the card is not valid  Preferred Pharmacy (with phone number or street name):  [id number] - [place], [place] - [address] [place] Phone: [phone]  Fax: [phone]     Has the patient been seen for an appointment in the last year OR does the patient have an upcoming appointment? Yes.    Please contact [facility] at [phone] for further instructions

## 2022-12-20 NOTE — Telephone Encounter (Signed)
Returned [employer] from [place] about an ICD-code for DM or insulin resistance, Lavella Hammock was informed that Patient has neither problem. Starla placed information in her records and informed me that the [facility] card will not work for anything other than patient having DM

## 2022-12-20 NOTE — Addendum Note (Signed)
Addended by: Marnee Guarneri T on: 12/20/2022 05:19 PM   Modules accepted: Orders

## 2023-01-05 ENCOUNTER — Other Ambulatory Visit: Payer: Self-pay | Admitting: Nurse Practitioner

## 2023-01-07 ENCOUNTER — Encounter: Payer: Self-pay | Admitting: Nurse Practitioner

## 2023-01-07 ENCOUNTER — Other Ambulatory Visit: Payer: Self-pay

## 2023-01-07 ENCOUNTER — Other Ambulatory Visit: Payer: Self-pay | Admitting: Nurse Practitioner

## 2023-01-07 MED ORDER — GEMFIBROZIL 600 MG PO TABS: ORAL_TABLET | ORAL | 17 refills | Status: DC

## 2023-01-07 NOTE — Telephone Encounter (Signed)
Refilled 01/07/23 #60 with 17 refills. Requested Prescriptions  Refused Prescriptions Disp Refills   gemfibrozil (LOPID) 600 MG tablet [Pharmacy Med Name: GEMFIBROZIL 600 MG TABLET] 60 tablet 17    Sig: TAKE 1 TABLET BY MOUTH TWICE A DAY BEFORE MEALS     Cardiovascular:  Antilipid - Fibric Acid Derivatives Failed - 01/05/2023  4:34 PM      Failed - PLT in normal range and within 360 days    Platelets  Date Value Ref Range Status  12/10/2022 478 (H) 150 - 450 x10E3/uL Final         Failed - Lipid Panel in normal range within the last 12 months    Cholesterol, Total  Date Value Ref Range Status  12/10/2022 253 (H) 100 - 199 mg/dL Final   Cholesterol Piccolo, Waived  Date Value Ref Range Status  04/04/2016 223 (H) <200 mg/dL Final    Comment:                            Desirable                <200                         Borderline High      200- 239                         High                     >239    LDL Chol Calc (NIH)  Date Value Ref Range Status  12/10/2022 147 (H) 0 - 99 mg/dL Final   HDL  Date Value Ref Range Status  12/10/2022 52 >39 mg/dL Final   Triglycerides  Date Value Ref Range Status  12/10/2022 298 (H) 0 - 149 mg/dL Final   Triglycerides Piccolo,Waived  Date Value Ref Range Status  04/04/2016 203 (H) <150 mg/dL Final    Comment:                            Normal                   <150                         Borderline High     150 - 199                         High                200 - 499                         Very High                >499          Passed - ALT in normal range and within 360 days    ALT  Date Value Ref Range Status  12/10/2022 13 0 - 32 IU/L Final         Passed - AST in normal range and within 360 days    AST  Date Value Ref Range Status  12/10/2022 13 0 - 40 IU/L Final  Passed - Cr in normal range and within 360 days    Creatinine, Ser  Date Value Ref Range Status  12/10/2022 0.69 0.57 - 1.00 mg/dL Final          Passed - HGB in normal range and within 360 days    Hemoglobin  Date Value Ref Range Status  12/10/2022 12.6 11.1 - 15.9 g/dL Final         Passed - HCT in normal range and within 360 days    Hematocrit  Date Value Ref Range Status  12/10/2022 38.1 34.0 - 46.6 % Final         Passed - WBC in normal range and within 360 days    WBC  Date Value Ref Range Status  12/10/2022 10.3 3.4 - 10.8 x10E3/uL Final         Passed - eGFR is 30 or above and within 360 days    GFR calc Af Amer  Date Value Ref Range Status  11/01/2020 110 >59 mL/min/1.73 Final    Comment:    **In accordance with recommendations from the NKF-ASN Task force,**   [facility] is in the process of updating its eGFR calculation to the   2021 CKD-EPI creatinine equation that estimates kidney function   without a race variable.    GFR calc non Af Amer  Date Value Ref Range Status  11/01/2020 95 >59 mL/min/1.73 Final   eGFR  Date Value Ref Range Status  12/10/2022 106 >59 mL/min/1.73 Final         Passed - Valid encounter within last 12 months    Recent Outpatient Visits           4 weeks ago Depression, major, single episode, complete remission ([facility])   [place] [facility] [place], Dorie Rank, NP   5 months ago Tinea corporis   [place] [facility] [place], [place] T, NP   7 months ago Essential hypertension   [place] [facility] [place], Corrie Dandy T, NP   1 year ago Depression, major, single episode, complete remission ([facility])   [place] [facility] [place], Dorie Rank, NP   1 year ago Essential hypertension    [facility] [place], Dorie Rank, NP       Future Appointments             In 5 months Cannady, Dorie Rank, NP  [facility], [facility]

## 2023-01-07 NOTE — Telephone Encounter (Signed)
Medication refill for gemfibrozil   last ov 12/10/22, upcoming ov 06/12/23 . Please advise

## 2023-01-11 ENCOUNTER — Encounter: Payer: Self-pay | Admitting: Nurse Practitioner

## 2023-01-11 MED ORDER — TIRZEPATIDE 5 MG/0.5ML ~~LOC~~ SOAJ: 5.0000 mg | SUBCUTANEOUS | 0 refills | Status: DC

## 2023-01-15 MED ORDER — TIRZEPATIDE 5 MG/0.5ML ~~LOC~~ SOAJ: 5.0000 mg | SUBCUTANEOUS | 0 refills | Status: DC

## 2023-01-15 NOTE — Addendum Note (Signed)
Addended by: Aura Dials T on: 01/15/2023 11:43 AM   Modules accepted: Orders

## 2023-01-18 ENCOUNTER — Ambulatory Visit
Admission: RE | Admit: 2023-01-18 | Discharge: 2023-01-18 | Disposition: A | Discharge status: Home / Self Care | Payer: Managed Care, Other (non HMO) | Source: Ambulatory Visit | Attending: Obstetrics and Gynecology | Admitting: Obstetrics and Gynecology

## 2023-01-18 DIAGNOSIS — Z1231 Encounter for screening mammogram for malignant neoplasm of breast: Secondary | ICD-10-CM | POA: Insufficient documentation

## 2023-02-06 ENCOUNTER — Other Ambulatory Visit: Payer: Self-pay | Admitting: Nurse Practitioner

## 2023-02-06 MED ORDER — TIRZEPATIDE 7.5 MG/0.5ML ~~LOC~~ SOAJ: 7.5000 mg | SUBCUTANEOUS | 3 refills | Status: DC

## 2023-03-04 MED ORDER — TIRZEPATIDE 10 MG/0.5ML ~~LOC~~ SOAJ: 10.0000 mg | SUBCUTANEOUS | 4 refills | Status: DC

## 2023-03-04 NOTE — Addendum Note (Signed)
Addended by: Aura Dials T on: 03/04/2023 05:17 PM   Modules accepted: Orders

## 2023-03-29 MED ORDER — TIRZEPATIDE 12.5 MG/0.5ML ~~LOC~~ SOAJ: 12.5000 mg | SUBCUTANEOUS | 2 refills | Status: DC

## 2023-03-29 NOTE — Addendum Note (Signed)
Addended by: Aura Dials T on: 03/29/2023 12:40 PM   Modules accepted: Orders

## 2023-04-26 MED ORDER — TIRZEPATIDE 15 MG/0.5ML ~~LOC~~ SOAJ: 15.0000 mg | SUBCUTANEOUS | 4 refills | Status: DC

## 2023-04-26 NOTE — Addendum Note (Signed)
Addended by: Aura Dials T on: 04/26/2023 05:13 PM   Modules accepted: Orders

## 2023-06-08 NOTE — Patient Instructions (Signed)
Be Involved in Caring For Your Health:  Taking Medications When medications are taken as directed, they can greatly improve your health. But if they are not taken as prescribed, they may not work. In some cases, not taking them correctly can be harmful. To help ensure your treatment remains effective and safe, understand your medications and how to take them. Bring your medications to each visit for review by your provider.  Your lab results, notes, and after visit summary will be available on My Chart. We strongly encourage you to use this feature. If lab results are abnormal the clinic will contact you with the appropriate steps. If the clinic does not contact you assume the results are satisfactory. You can always view your results on My Chart. If you have questions regarding your health or results, please contact the clinic during office hours. You can also ask questions on My Chart.  We at [facility] are grateful that you chose [name] to provide your care. We strive to provide evidence-based and compassionate care and are always looking for feedback. If you get a survey from the clinic please complete this so we can hear your opinions.  Healthy Eating, Adult Healthy eating may help you get and keep a healthy body weight, reduce the risk of chronic disease, and live a long and productive life. It is important to follow a healthy eating pattern. Your nutritional and calorie needs should be met mainly by different nutrient-rich foods. What are tips for following this plan? Reading food labels Read labels and choose the following: Reduced or low sodium products. Juices with 100% fruit juice. Foods with low saturated fats (<3 g per serving) and high polyunsaturated and monounsaturated fats. Foods with whole grains, such as whole wheat, cracked wheat, brown rice, and wild rice. Whole grains that are fortified with folic acid. This is recommended for females who are pregnant or who want to  become pregnant. Read labels and do not eat or drink the following: Foods or drinks with added sugars. These include foods that contain brown sugar, corn sweetener, corn syrup, dextrose, fructose, glucose, high-fructose corn syrup, honey, invert sugar, lactose, malt syrup, maltose, molasses, raw sugar, sucrose, trehalose, or turbinado sugar. Limit your intake of added sugars to less than 10% of your total daily calories. Do not eat more than the following amounts of added sugar per day: 6 teaspoons (25 g) for females. 9 teaspoons (38 g) for males. Foods that contain processed or refined starches and grains. Refined grain products, such as white flour, degermed cornmeal, white bread, and white rice. Shopping Choose nutrient-rich snacks, such as vegetables, whole fruits, and nuts. Avoid high-calorie and high-sugar snacks, such as potato chips, fruit snacks, and candy. Use oil-based dressings and spreads on foods instead of solid fats such as butter, margarine, sour cream, or cream cheese. Limit pre-made sauces, mixes, and "instant" products such as flavored rice, instant noodles, and ready-made pasta. Try more plant-protein sources, such as tofu, tempeh, black beans, edamame, lentils, nuts, and seeds. Explore eating plans such as the Mediterranean diet or vegetarian diet. Try heart-healthy dips made with beans and healthy fats like hummus and guacamole. Vegetables go great with these. Cooking Use oil to saut or stir-fry foods instead of solid fats such as butter, margarine, or lard. Try baking, boiling, grilling, or broiling instead of frying. Remove the fatty part of meats before cooking. Steam vegetables in water or broth. Meal planning  At meals, imagine dividing your plate into fourths: One-half of  your plate is fruits and vegetables. One-fourth of your plate is whole grains. One-fourth of your plate is protein, especially lean meats, poultry, eggs, tofu, beans, or nuts. Include low-fat  dairy as part of your daily diet. Lifestyle Choose healthy options in all settings, including home, work, school, restaurants, or stores. Prepare your food safely: Wash your hands after handling raw meats. Where you prepare food, keep surfaces clean by regularly washing with hot, soapy water. Keep raw meats separate from ready-to-eat foods, such as fruits and vegetables. Cook seafood, meat, poultry, and eggs to the recommended temperature. Get a food thermometer. Store foods at safe temperatures. In general: Keep cold foods at 48F (4.4C) or below. Keep hot foods at 148F (60C) or above. Keep your freezer at [facility] (-17.8C) or below. Foods are not safe to eat if they have been between the temperatures of 40-148F (4.4-60C) for more than 2 hours. What foods should I eat? Fruits Aim to eat 1-2 cups of fresh, canned (in natural juice), or frozen fruits each day. One cup of fruit equals 1 small apple, 1 large banana, 8 large strawberries, 1 cup (237 g) canned fruit,  cup (82 g) dried fruit, or 1 cup (240 mL) 100% juice. Vegetables Aim to eat 2-4 cups of fresh and frozen vegetables each day, including different varieties and colors. One cup of vegetables equals 1 cup (91 g) broccoli or cauliflower florets, 2 medium carrots, 2 cups (150 g) raw, leafy greens, 1 large tomato, 1 large bell pepper, 1 large sweet potato, or 1 medium white potato. Grains Aim to eat 5-10 ounce-equivalents of whole grains each day. Examples of 1 ounce-equivalent of grains include 1 slice of bread, 1 cup (40 g) ready-to-eat cereal, 3 cups (24 g) popcorn, or  cup (93 g) cooked rice. Meats and other proteins Try to eat 5-7 ounce-equivalents of protein each day. Examples of 1 ounce-equivalent of protein include 1 egg,  oz nuts (12 almonds, 24 pistachios, or 7 walnut halves), 1/4 cup (90 g) cooked beans, 6 tablespoons (90 g) hummus or 1 tablespoon (16 g) peanut butter. A cut of meat or fish that is the size of a deck of  cards is about 3-4 ounce-equivalents (85 g). Of the protein you eat each week, try to have at least 8 sounce (227 g) of seafood. This is about 2 servings per week. This includes salmon, trout, herring, sardines, and anchovies. Dairy Aim to eat 3 cup-equivalents of fat-free or low-fat dairy each day. Examples of 1 cup-equivalent of dairy include 1 cup (240 mL) milk, 8 ounces (250 g) yogurt, 1 ounces (44 g) natural cheese, or 1 cup (240 mL) fortified soy milk. Fats and oils Aim for about 5 teaspoons (21 g) of fats and oils per day. Choose monounsaturated fats, such as canola and olive oils, mayonnaise made with olive oil or avocado oil, avocados, peanut butter, and most nuts, or polyunsaturated fats, such as sunflower, corn, and soybean oils, walnuts, pine nuts, sesame seeds, sunflower seeds, and flaxseed. Beverages Aim for 6 eight-ounce glasses of water per day. Limit coffee to 3-5 eight-ounce cups per day. Limit caffeinated beverages that have added calories, such as soda and energy drinks. If you drink alcohol: Limit how much you have to: 0-1 drink a day if you are female. 0-2 drinks a day if you are female. Know how much alcohol is in your drink. In the [place] one drink is one 12 oz bottle of beer (355 mL), one 5 oz glass of wine (  148 mL), or one 1 oz glass of hard liquor (44 mL). Seasoning and other foods Try not to add too much salt to your food. Try using herbs and spices instead of salt. Try not to add sugar to food. This information is based on [place] nutrition guidelines. To learn more, visit [employer].be. Exact amounts may vary. You may need different amounts. This information is not intended to replace advice given to you by your health care provider. Make sure you discuss any questions you have with your health care provider. Document Revised: 06/04/2022 Document Reviewed: 06/04/2022 Elsevier Patient Education  2024 [employer].

## 2023-06-12 ENCOUNTER — Encounter: Payer: Self-pay | Admitting: Nurse Practitioner

## 2023-06-12 ENCOUNTER — Ambulatory Visit: Payer: Managed Care, Other (non HMO) | Admitting: Nurse Practitioner

## 2023-06-12 VITALS — BP 128/87 | HR 89 | Temp 98.4°F | Wt 172.6 lb

## 2023-06-12 DIAGNOSIS — E6609 Other obesity due to excess calories: Secondary | ICD-10-CM | POA: Diagnosis not present

## 2023-06-12 DIAGNOSIS — F325 Major depressive disorder, single episode, in full remission: Secondary | ICD-10-CM | POA: Diagnosis not present

## 2023-06-12 DIAGNOSIS — Z23 Encounter for immunization: Secondary | ICD-10-CM | POA: Diagnosis not present

## 2023-06-12 DIAGNOSIS — Z6831 Body mass index (BMI) 31.0-31.9, adult: Secondary | ICD-10-CM

## 2023-06-12 DIAGNOSIS — I10 Essential (primary) hypertension: Secondary | ICD-10-CM

## 2023-06-12 DIAGNOSIS — E782 Mixed hyperlipidemia: Secondary | ICD-10-CM | POA: Diagnosis not present

## 2023-06-12 NOTE — Progress Notes (Signed)
BP 128/87   Pulse 89   Temp 98.4 F (36.9 C) (Oral)   Wt 172 lb 9.6 oz (78.3 kg)   LMP 05/23/2023 (Approximate)   SpO2 97%   BMI 31.05 kg/m    Subjective:    Patient ID: Julia Campbell, female    DOB: 11-27-1975, 47 y.o.   MRN: [id number]  HPI: Julia Campbell is a 47 y.o. female  Chief Complaint  Patient presents with   Hyperlipidemia   Hypertension   Weight Check   HYPERTENSION / HYPERLIPIDEMIA Currently on Losartan, HCTZ, and Lopid (takes once a day only).  Had Covid 3 weeks ago and the cough is still present.    Is taking Mounjaro 15 MG weekly, does find appetite is reduced. Goal weight is 140 lbs.  Is not exercising.  Has lost 5 lbs. Satisfied with current treatment? yes Duration of hypertension: chronic BP monitoring frequency: not checking BP range:  BP medication side effects: no Duration of hyperlipidemia: chronic Cholesterol medication side effects: no Cholesterol supplements: none Medication compliance: good compliance Aspirin: no Recent stressors: no Recurrent headaches: no Visual changes: no Palpitations: no Dyspnea: no Chest pain: no Lower extremity edema: no Dizzy/lightheaded: no  The 10-year ASCVD risk score (Arnett DK, et al., 2019) is: 2.3%   Values used to calculate the score:     Age: 33 years     Sex: Female     Is Non-Hispanic African American: No     Diabetic: No     Tobacco smoker: No     Systolic Blood Pressure: 128 mmHg     Is BP treated: Yes     HDL Cholesterol: 52 mg/dL     Total Cholesterol: 253 mg/dL   DEPRESSION No current medications. Mood status: stable Satisfied with current treatment?: yes Symptom severity: mild  Previous psychiatric medications: Wellbutrin Depressed mood: no Anxious mood: no Anhedonia: no Significant weight loss or gain: no Insomnia: none Fatigue: no Feelings of worthlessness or guilt: no Impaired concentration/indecisiveness: no Suicidal ideations: no Hopelessness: no Crying spells: no     06/12/2023    8:28 AM 12/10/2022    8:13 AM 07/17/2022    4:03 PM 12/08/2021    8:08 AM 05/02/2021    8:41 AM  Depression screen PHQ 2/9  Decreased Interest 0 0 0 0 0  Down, Depressed, Hopeless 0 0 0 0 0  PHQ - 2 Score 0 0 0 0 0  Altered sleeping 0 0 0 0 0  Tired, decreased energy 0 0 0 0 0  Change in appetite 0 1 0 0 0  Feeling bad or failure about yourself  0 0 0 0 0  Trouble concentrating 0 0 0 0 0  Moving slowly or fidgety/restless 0 0 0 0 0  Suicidal thoughts 0 0 0 0 0  PHQ-9 Score 0 1 0 0 0  Difficult doing work/chores Not difficult at all Not difficult at all   Not difficult at all       06/12/2023    8:28 AM 12/10/2022    8:14 AM 07/17/2022    4:03 PM 12/08/2021    8:08 AM  GAD 7 : Generalized Anxiety Score  Nervous, Anxious, on Edge 0 0 0 1  Control/stop worrying 0 0 0 0  Worry too much - different things 0 0 0 1  Trouble relaxing 0 0 0 0  Restless 0 0 0 0  Easily annoyed or irritable 0 0 0 1  Afraid - awful might happen  0 0 0 1  Total GAD 7 Score 0 0 0 4  Anxiety Difficulty Not difficult at all Not difficult at all  Not difficult at all    Relevant past medical, surgical, family and social history reviewed and updated as indicated. Interim medical history since our last visit reviewed. Allergies and medications reviewed and updated.  Review of Systems  Constitutional:  Negative for activity change, appetite change, diaphoresis, fatigue and fever.  HENT:  Negative for mouth sores.   Respiratory:  Negative for cough, chest tightness and shortness of breath.   Cardiovascular:  Negative for chest pain, palpitations and leg swelling.  Gastrointestinal: Negative.   Endocrine: Negative.   Neurological: Negative.   Psychiatric/Behavioral: Negative.      Per HPI unless specifically indicated above     Objective:    BP 128/87   Pulse 89   Temp 98.4 F (36.9 C) (Oral)   Wt 172 lb 9.6 oz (78.3 kg)   LMP 05/23/2023 (Approximate)   SpO2 97%   BMI 31.05 kg/m   Wt  Readings from Last 3 Encounters:  06/12/23 172 lb 9.6 oz (78.3 kg)  12/10/22 177 lb 8 oz (80.5 kg)  08/14/22 175 lb (79.4 kg)    Physical Exam Vitals and nursing note reviewed.  Constitutional:      General: She is awake. She is not in acute distress.    Appearance: She is well-developed and well-groomed. She is obese. She is not ill-appearing or toxic-appearing.  HENT:     Head: Normocephalic.     Right Ear: Hearing normal.     Left Ear: Hearing normal.  Eyes:     General: Lids are normal.        Right eye: No discharge.        Left eye: No discharge.     Conjunctiva/sclera: Conjunctivae normal.     Pupils: Pupils are equal, round, and reactive to light.  Neck:     Thyroid: No thyromegaly.     Vascular: No carotid bruit.  Cardiovascular:     Rate and Rhythm: Normal rate and regular rhythm.     Heart sounds: Normal heart sounds. No murmur heard.    No gallop.  Pulmonary:     Effort: Pulmonary effort is normal. No accessory muscle usage or respiratory distress.     Breath sounds: Normal breath sounds.  Abdominal:     General: Bowel sounds are normal.     Palpations: Abdomen is soft.  Musculoskeletal:     Cervical back: Normal range of motion and neck supple.     Right lower leg: No edema.     Left lower leg: No edema.  Skin:    General: Skin is warm and dry.  Neurological:     Mental Status: She is alert and oriented to person, place, and time.  Psychiatric:        Attention and Perception: Attention normal.        Mood and Affect: Mood normal.        Behavior: Behavior normal. Behavior is cooperative.        Thought Content: Thought content normal.        Judgment: Judgment normal.    Results for orders placed or performed in visit on 12/10/22  CBC with Differential/Platelet  Result Value Ref Range   WBC 10.3 3.4 - 10.8 x10E3/uL   RBC 4.18 3.77 - 5.28 x10E6/uL   Hemoglobin 12.6 11.1 - 15.9 g/dL   Hematocrit 21.3 08.6 -  46.6 %   MCV 91 79 - 97 fL   MCH 30.1  26.6 - 33.0 pg   MCHC 33.1 31.5 - 35.7 g/dL   RDW 40.9 81.1 - 91.4 %   Platelets 478 (H) 150 - 450 x10E3/uL   Neutrophils 63 Not Estab. %   Lymphs 30 Not Estab. %   Monocytes 4 Not Estab. %   Eos 2 Not Estab. %   Basos 1 Not Estab. %   Neutrophils Absolute 6.5 1.4 - 7.0 x10E3/uL   Lymphocytes Absolute 3.0 0.7 - 3.1 x10E3/uL   Monocytes Absolute 0.5 0.1 - 0.9 x10E3/uL   EOS (ABSOLUTE) 0.2 0.0 - 0.4 x10E3/uL   Basophils Absolute 0.1 0.0 - 0.2 x10E3/uL   Immature Granulocytes 0 Not Estab. %   Immature Grans (Abs) 0.0 0.0 - 0.1 x10E3/uL  Comprehensive metabolic panel  Result Value Ref Range   Glucose 85 70 - 99 mg/dL   BUN 13 6 - 24 mg/dL   Creatinine, Ser 7.82 0.57 - 1.00 mg/dL   eGFR 956 >21 HY/QMV/7.84   BUN/Creatinine Ratio 19 9 - 23   Sodium 138 134 - 144 mmol/L   Potassium 4.2 3.5 - 5.2 mmol/L   Chloride 100 96 - 106 mmol/L   CO2 21 20 - 29 mmol/L   Calcium 9.3 8.7 - 10.2 mg/dL   Total Protein 6.8 6.0 - 8.5 g/dL   Albumin 4.3 3.9 - 4.9 g/dL   Globulin, Total 2.5 1.5 - 4.5 g/dL   Albumin/Globulin Ratio 1.7 1.2 - 2.2   Bilirubin Total 0.3 0.0 - 1.2 mg/dL   Alkaline Phosphatase 69 44 - 121 IU/L   AST 13 0 - 40 IU/L   ALT 13 0 - 32 IU/L  Lipid Panel w/o Chol/HDL Ratio  Result Value Ref Range   Cholesterol, Total 253 (H) 100 - 199 mg/dL   Triglycerides 696 (H) 0 - 149 mg/dL   HDL 52 >29 mg/dL   VLDL Cholesterol Cal 54 (H) 5 - 40 mg/dL   LDL Chol Calc (NIH) 528 (H) 0 - 99 mg/dL  TSH  Result Value Ref Range   TSH 2.670 0.450 - 4.500 uIU/mL  VITAMIN D 25 Hydroxy (Vit-D Deficiency, Fractures)  Result Value Ref Range   Vit D, 25-Hydroxy 25.8 (L) 30.0 - 100.0 ng/mL      Assessment & Plan:   Problem List Items Addressed This Visit       Cardiovascular and Mediastinum   Essential hypertension    Chronic, stable.  BP remains at goal in office.  Recommend she monitor BP at least a few mornings a week at home and document.  DASH diet at home.  Continue current medication  regimen and adjust as needed.  Labs today: CMP.  Return in 6 months.         Other   Depression, major, single episode, complete remission (HCC) - Primary    Remains in remission, no medications.  Denies SI/HI.  Restart Wellbutrin as needed in future.  PHQ9 - 0 today.  Return in 6 months.      Hyperlipidemia    Chronic, ongoing.  Continue current medication regimen and adjust as needed.  Lipid panel today. Return in 6 months.      Relevant Orders   Comprehensive metabolic panel   Lipid Panel w/o Chol/HDL Ratio   Obesity    BMI 31.05, taking Mounjaro and is seeing some benefit.  No family history of thyroid cancer (MTC, MEN 2, thyroid cell tumors)  or pancreatitis. Recommended eating smaller high protein, low fat meals more frequently and exercising 30 mins a day 5 times a week with a goal of 10-15lb weight loss in the next 3 months. Patient voiced their understanding and motivation to adhere to these recommendations.       Other Visit Diagnoses     Flu vaccine need       Flu vaccine in office today, educated patient.   Relevant Orders   Flu vaccine trivalent PF, 6mos and older(Flulaval,Afluria,Fluarix,Fluzone) (Completed)        Follow up plan: No follow-ups on file.

## 2023-06-12 NOTE — Progress Notes (Signed)
Appt has been made.

## 2023-06-12 NOTE — Assessment & Plan Note (Addendum)
Remains in remission, no medications.  Denies SI/HI.  Restart Wellbutrin as needed in future.  PHQ9 - 0 today.  Return in 6 months.

## 2023-06-12 NOTE — Assessment & Plan Note (Signed)
BMI 31.05, taking Mounjaro and is seeing some benefit.  No family history of thyroid cancer (MTC, MEN 2, thyroid cell tumors) or pancreatitis. Recommended eating smaller high protein, low fat meals more frequently and exercising 30 mins a day 5 times a week with a goal of 10-15lb weight loss in the next 3 months. Patient voiced their understanding and motivation to adhere to these recommendations.

## 2023-06-12 NOTE — Assessment & Plan Note (Signed)
Chronic, stable.  BP remains at goal in office.  Recommend she monitor BP at least a few mornings a week at home and document.  DASH diet at home.  Continue current medication regimen and adjust as needed.  Labs today: CMP.  Return in 6 months.

## 2023-06-12 NOTE — Assessment & Plan Note (Signed)
Chronic, ongoing.  Continue current medication regimen and adjust as needed.  Lipid panel today.  Return in 6 months.

## 2023-06-13 LAB — COMPREHENSIVE METABOLIC PANEL
ALT: 9 IU/L (ref 0–32)
AST: 12 IU/L (ref 0–40)
Albumin: 4.5 g/dL (ref 3.9–4.9)
Alkaline Phosphatase: 62 IU/L (ref 44–121)
BUN/Creatinine Ratio: 14 (ref 9–23)
BUN: 10 mg/dL (ref 6–24)
Bilirubin Total: 0.3 mg/dL (ref 0.0–1.2)
CO2: 20 mmol/L (ref 20–29)
Calcium: 9.8 mg/dL (ref 8.7–10.2)
Chloride: 101 mmol/L (ref 96–106)
Creatinine, Ser: 0.74 mg/dL (ref 0.57–1.00)
Globulin, Total: 2.7 g/dL (ref 1.5–4.5)
Glucose: 87 mg/dL (ref 70–99)
Potassium: 3.9 mmol/L (ref 3.5–5.2)
Sodium: 139 mmol/L (ref 134–144)
Total Protein: 7.2 g/dL (ref 6.0–8.5)
eGFR: 99 mL/min/{1.73_m2} (ref >59)

## 2023-06-13 LAB — LIPID PANEL W/O CHOL/HDL RATIO
Cholesterol, Total: 214 mg/dL — ABNORMAL HIGH (ref 100–199)
HDL: 46 mg/dL (ref >39)
LDL Chol Calc (NIH): 142 mg/dL — ABNORMAL HIGH (ref 0–99)
Triglycerides: 147 mg/dL (ref 0–149)
VLDL Cholesterol Cal: 26 mg/dL (ref 5–40)

## 2023-06-13 NOTE — Progress Notes (Signed)
Contacted via MyChart   Good evening Israelle, your labs have returned: - Kidney function, creatinine and eGFR, remains normal, as is liver function, AST and ALT.  - Cholesterol level and LDL remain elevated, but your triglycerides have come down into normal range -- continue Mounjaro and working on weight loss + continue Lopid.  Any questions? Keep being excellent!!  Thank you for allowing me to participate in your care.  I appreciate you. Kindest regards, Aurie Harroun

## 2023-06-28 ENCOUNTER — Other Ambulatory Visit: Payer: Self-pay | Admitting: Nurse Practitioner

## 2023-06-28 NOTE — Telephone Encounter (Signed)
Requested Prescriptions  Pending Prescriptions Disp Refills   losartan (COZAAR) 25 MG tablet [Pharmacy Med Name: LOSARTAN POTASSIUM 25 MG TAB] 30 tablet 6    Sig: TAKE 1 TABLET (25 MG TOTAL) BY MOUTH DAILY.     Cardiovascular:  Angiotensin Receptor Blockers Passed - 06/28/2023  2:28 AM      Passed - Cr in normal range and within 180 days    Creatinine, Ser  Date Value Ref Range Status  06/12/2023 0.74 0.57 - 1.00 mg/dL Final         Passed - K in normal range and within 180 days    Potassium  Date Value Ref Range Status  06/12/2023 3.9 3.5 - 5.2 mmol/L Final         Passed - Patient is not pregnant      Passed - Last BP in normal range    BP Readings from Last 1 Encounters:  06/12/23 128/87         Passed - Valid encounter within last 6 months    Recent Outpatient Visits           2 weeks ago Depression, major, single episode, complete remission (HCC)   [place] [facility] [place], [place] T, NP   6 months ago Depression, major, single episode, complete remission (HCC)   [place] [facility] [place], Dorie Rank, NP   11 months ago Tinea corporis   [place] [facility] [place], Corrie Dandy T, NP   1 year ago Essential hypertension   [place] [facility] [place], [place] T, NP   1 year ago Depression, major, single episode, complete remission (HCC)   [place] [facility] [place], Dorie Rank, NP       Future Appointments             In 5 months [place], Dorie Rank, NP [place] [facility]

## 2023-08-20 ENCOUNTER — Ambulatory Visit: Payer: Managed Care, Other (non HMO) | Admitting: Obstetrics and Gynecology

## 2023-08-26 NOTE — Progress Notes (Unsigned)
PCP: Marjie Skiff, NP   No chief complaint on file.   HPI:      [name] is a 47 y.o. G2P1102 whose LMP was No LMP recorded., presents today for her annual examination.  Her menses are regular every 28-30 days, lasting 5-7 days, mod flow.  Dysmenorrhea none. She does not have intermenstrual bleeding. Hx of leio. She does not have vasomotor sx.   Sex activity: single partner, contraception - tubal ligation. She does have vaginal dryness, improved with lubricants.  Last Pap: 07/25/22 Results were NILM/neg HPV DNA. ????PAPA???? 08/07/21  Results were: no abnormalities  Cervical Hx: 2014 LGSIL, Colpo Bx CIN I 2016 and 2017 ASCUS 2018 LGSIL 2018 Colpo Bx CIN I Cryo 11/2017 2019 Normal PAP 2020 Normal PAP 2021 Normal PAP   Last mammogram: 01/18/23 at [facility]; results were normal after RT breast addl imaging; repeat in 12 months.  There is no FH of breast cancer. There is no FH of ovarian cancer. The patient does do self-breast exams.  Colonoscopy: never; had to wait to age 13 for insurance coverage  Tobacco use: The patient denies current or previous tobacco use. Alcohol use: none No drug use Exercise: moderately active  She does get adequate calcium and Vitamin D in her diet.  Labs with PCP. Needs health form for work completed   Patient Active Problem List   Diagnosis Date Noted   Tinea corporis 07/17/2022   Vitamin D deficiency 04/29/2020   Depression, major, single episode, complete remission (HCC) 04/09/2017   Low grade squamous intraepith lesion on cytologic smear cervix (lgsil) 04/08/2017   Obesity 06/05/2016   Essential hypertension 04/04/2016   Hyperlipidemia     Past Surgical History:  Procedure Laterality Date   COLPOSCOPY     TUBAL LIGATION      Family History  Problem Relation Age of Onset   Kidney disease Mother        Stage 5   Hypertension Mother    Obesity Mother    Stroke Maternal Grandmother    Heart disease Maternal Grandmother     Hypertension Father    Throat cancer Father        late 8s   Heart attack Maternal Grandfather    Cancer Neg Hx    Diabetes Neg Hx     Social History   Socioeconomic History   Marital status: Married    Spouse name: Not on file   Number of children: 2   Years of education: Not on file   Highest education level: Not on file  Occupational History   Occupation: Fish farm manager  Tobacco Use   Smoking status: Never   Smokeless tobacco: Never  Vaping Use   Vaping status: Never Used  Substance and Sexual Activity   Alcohol use: No    Alcohol/week: 0.0 standard drinks of alcohol   Drug use: No   Sexual activity: Yes    Birth control/protection: None, Surgical    Comment: tubal ligation  Other Topics Concern   Not on file  Social History Narrative   Not on file   Social Determinants of Health   Financial Resource Strain: Not on file  Food Insecurity: Not on file  Transportation Needs: Not on file  Physical Activity: Not on file  Stress: Not on file  Social Connections: Not on file  Intimate Partner Violence: Not on file     Current Outpatient Medications:    gemfibrozil (LOPID) 600 MG tablet, TAKE 1 TABLET BY  MOUTH TWICE A DAY BEFORE MEALS, Disp: 60 tablet, Rfl: 17   hydrochlorothiazide (HYDRODIURIL) 25 MG tablet, Take 1 tablet (25 mg total) by mouth daily., Disp: 90 tablet, Rfl: 4   losartan (COZAAR) 25 MG tablet, TAKE 1 TABLET (25 MG TOTAL) BY MOUTH DAILY., Disp: 30 tablet, Rfl: 6   tirzepatide (MOUNJARO) 15 MG/0.5ML Pen, Inject 15 mg into the skin once a week., Disp: 2 mL, Rfl: 4     ROS:  Review of Systems  Constitutional:  Negative for fatigue, fever and unexpected weight change.  Respiratory:  Negative for cough, shortness of breath and wheezing.   Cardiovascular:  Negative for chest pain, palpitations and leg swelling.  Gastrointestinal:  Negative for blood in stool, constipation, diarrhea, nausea and vomiting.  Endocrine: Negative for cold  intolerance, heat intolerance and polyuria.  Genitourinary:  Negative for dyspareunia, dysuria, flank pain, frequency, genital sores, hematuria, menstrual problem, pelvic pain, urgency, vaginal bleeding, vaginal discharge and vaginal pain.  Musculoskeletal:  Negative for back pain, joint swelling and myalgias.  Skin:  Negative for rash.  Neurological:  Negative for dizziness, syncope, light-headedness, numbness and headaches.  Hematological:  Negative for adenopathy.  Psychiatric/Behavioral:  Negative for agitation, confusion, sleep disturbance and suicidal ideas. The patient is not nervous/anxious.    BREAST: No symptoms    Objective: There were no vitals taken for this visit.   Physical Exam Constitutional:      Appearance: She is well-developed.  Genitourinary:     Vulva normal.     Right Labia: No rash, tenderness or lesions.    Left Labia: No tenderness, lesions or rash.    No vaginal discharge, erythema or tenderness.      Right Adnexa: not tender and no mass present.    Left Adnexa: not tender and no mass present.    No cervical friability or polyp.     Uterus is not enlarged or tender.  Breasts:    Right: No mass, nipple discharge, skin change or tenderness.     Left: No mass, nipple discharge, skin change or tenderness.  Neck:     Thyroid: No thyromegaly.  Cardiovascular:     Rate and Rhythm: Normal rate and regular rhythm.     Heart sounds: Normal heart sounds. No murmur heard. Pulmonary:     Effort: Pulmonary effort is normal.     Breath sounds: Normal breath sounds.  Abdominal:     Palpations: Abdomen is soft.     Tenderness: There is no abdominal tenderness. There is no guarding or rebound.  Musculoskeletal:        General: Normal range of motion.     Cervical back: Normal range of motion.  Lymphadenopathy:     Cervical: No cervical adenopathy.  Neurological:     General: No focal deficit present.     Mental Status: She is alert and oriented to person,  place, and time.     Cranial Nerves: No cranial nerve deficit.  Skin:    General: Skin is warm and dry.  Psychiatric:        Mood and Affect: Mood normal.        Behavior: Behavior normal.        Thought Content: Thought content normal.        Judgment: Judgment normal.  Vitals reviewed.     Assessment/Plan:  Encounter for annual routine gynecological examination  Cervical cancer screening - Plan: Cytology - PAP  Screening for HPV (human papillomavirus) - Plan: Cytology - PAP  CIN I (cervical intraepithelial neoplasia I) - Plan: Cytology - PAP; repeat pap today  Intramural leiomyoma of uterus--menses WNL  Encounter for screening mammogram for malignant neoplasm of breast - Plan: MM 3D SCREEN BREAST BILATERAL; pt to schedule mammo  Screening for colon cancer--colonoscopy discussed. Pt will check with insurance to see if covered now since [employer] covered service. Will call for ref prn.  Health form completed for work         GYN counsel breast self exam, mammography screening, adequate intake of calcium and vitamin D, diet and exercise    F/U  No follow-ups on file.  Harlow Carrizales B. Abriel Hattery, PA-C 08/26/2023 5:15 PM

## 2023-08-27 ENCOUNTER — Encounter: Payer: Self-pay | Admitting: Obstetrics and Gynecology

## 2023-08-27 ENCOUNTER — Ambulatory Visit (INDEPENDENT_AMBULATORY_CARE_PROVIDER_SITE_OTHER): Payer: Managed Care, Other (non HMO) | Admitting: Obstetrics and Gynecology

## 2023-08-27 VITALS — BP 115/78 | HR 85 | Ht 62.5 in | Wt 174.0 lb

## 2023-08-27 DIAGNOSIS — Z1231 Encounter for screening mammogram for malignant neoplasm of breast: Secondary | ICD-10-CM

## 2023-08-27 DIAGNOSIS — Z1211 Encounter for screening for malignant neoplasm of colon: Secondary | ICD-10-CM

## 2023-08-27 DIAGNOSIS — Z124 Encounter for screening for malignant neoplasm of cervix: Secondary | ICD-10-CM

## 2023-08-27 DIAGNOSIS — Z01419 Encounter for gynecological examination (general) (routine) without abnormal findings: Secondary | ICD-10-CM | POA: Diagnosis not present

## 2023-08-27 NOTE — Patient Instructions (Signed)
I value your feedback and you entrusting us with your care. If you get a [facility] patient survey, I would appreciate you taking the time to let us know about your experience today. Thank you! ? ? ?

## 2023-08-28 ENCOUNTER — Encounter: Payer: Self-pay | Admitting: Obstetrics and Gynecology

## 2023-09-15 ENCOUNTER — Other Ambulatory Visit: Payer: Self-pay | Admitting: Nurse Practitioner

## 2023-09-16 ENCOUNTER — Other Ambulatory Visit: Payer: Self-pay

## 2023-09-16 MED ORDER — TIRZEPATIDE 15 MG/0.5ML ~~LOC~~ SOAJ: 15.0000 mg | SUBCUTANEOUS | 4 refills | Status: DC

## 2023-09-19 ENCOUNTER — Encounter: Payer: Self-pay | Admitting: Nurse Practitioner

## 2023-09-19 NOTE — Telephone Encounter (Signed)
 Requested Prescriptions  Pending Prescriptions Disp Refills   MOUNJARO  15 MG/0.5ML Pen [Pharmacy Med Name: Mounjaro  15 MG/0.5ML Subcutaneous Solution Pen-injector] 4 mL 0    Sig: INJECT 15 ML SUBCUTANEOUSLY ONCE A WEEK     Off-Protocol Failed - 09/19/2023  1:37 PM      Failed - Medication not assigned to a protocol, review manually.      Passed - Valid encounter within last 12 months    Recent Outpatient Visits           3 months ago Depression, major, single episode, complete remission (HCC)   [place] [facility] [place], [place] T, NP   9 months ago Depression, major, single episode, complete remission (HCC)   [place] [facility] [place], Melanie T, NP   1 year ago Tinea corporis   [place] [facility] [place], Melanie T, NP   1 year ago Essential hypertension   [place] [facility] [place], Melanie T, NP   1 year ago Depression, major, single episode, complete remission (HCC)   [place] [facility] [place] Melanie DASEN, NP       Future Appointments             In 2 months Cannady, Jolene T, NP [facility]

## 2023-09-20 ENCOUNTER — Other Ambulatory Visit: Payer: Self-pay

## 2023-09-20 MED ORDER — TIRZEPATIDE 15 MG/0.5ML ~~LOC~~ SOAJ: 15.0000 mg | SUBCUTANEOUS | 4 refills | Status: DC

## 2023-10-23 LAB — COLOGUARD: COLOGUARD: NEGATIVE

## 2023-10-24 ENCOUNTER — Encounter: Payer: Self-pay | Admitting: Obstetrics and Gynecology

## 2023-11-06 ENCOUNTER — Encounter: Payer: Self-pay | Admitting: Nurse Practitioner

## 2023-11-06 ENCOUNTER — Telehealth: Payer: Managed Care, Other (non HMO) | Admitting: Nurse Practitioner

## 2023-11-06 DIAGNOSIS — J0110 Acute frontal sinusitis, unspecified: Secondary | ICD-10-CM | POA: Diagnosis not present

## 2023-11-06 MED ORDER — PREDNISONE 20 MG PO TABS: 40.0000 mg | ORAL_TABLET | Freq: Every day | ORAL | 0 refills | Status: AC

## 2023-11-06 MED ORDER — AZITHROMYCIN 250 MG PO TABS: ORAL_TABLET | ORAL | 0 refills | Status: AC

## 2023-11-06 NOTE — Progress Notes (Signed)
There were no vitals taken for this visit.   Subjective:    Patient ID: Julia Campbell, female    DOB: August 30, 1976, 48 y.o.   MRN: [id number]  HPI: Julia Campbell is a 48 y.o. female  Chief Complaint  Patient presents with   URI    Patient states she has been having a cough, congestion, scratchy throat, headaches, and sinus pressure since last Thursday. States she has been taking ibuprofen and Dayquil. States symptoms improve with the medicine but come back once it wears off.    Virtual Visit via Video Note  I connected with Julia Campbell on 11/06/23 at  1:00 PM EST by a video enabled telemedicine application and verified that I am speaking with the correct person using two identifiers.  Location: Patient: home Provider: home   I discussed the limitations of evaluation and management by telemedicine and the availability of in person appointments. The patient expressed understanding and agreed to proceed.  I discussed the assessment and treatment plan with the patient. The patient was provided an opportunity to ask questions and all were answered. The patient agreed with the plan and demonstrated an understanding of the instructions.   The patient was advised to call back or seek an in-person evaluation if the symptoms worsen or if the condition fails to improve as anticipated.  I provided 25 minutes of non-face-to-face time during this encounter.   Marjie Skiff, NP   UPPER RESPIRATORY TRACT INFECTION Presents today for illness x one week with no improvement.  Started out with stuffy nose and headache + scratchy throat which is worse at night. Covid negative at home. Fever: no Cough: yes Shortness of breath: no Wheezing: no Chest pain: no Chest tightness: no Chest congestion: no Nasal congestion: yes Runny nose: yes Post nasal drip: yes Sneezing: no Sore throat: yes Swollen glands: no Sinus pressure: yes Headache: yes Face pain: no Toothache: no Ear pain:  none Ear pressure: none Eyes red/itching:no Eye drainage/crusting: no  Vomiting: no Rash: no Fatigue: yes Sick contacts: no Strep contacts: no  Context: worse Recurrent sinusitis: no Relief with OTC cold/cough medications: no  Treatments attempted: Dayquil and Nyquil    Relevant past medical, surgical, family and social history reviewed and updated as indicated. Interim medical history since our last visit reviewed. Allergies and medications reviewed and updated.  Review of Systems  Constitutional:  Positive for fatigue. Negative for activity change, appetite change, chills and fever.  HENT:  Positive for congestion, postnasal drip, rhinorrhea, sinus pressure, sinus pain and sore throat. Negative for ear discharge, ear pain, facial swelling, sneezing and voice change.   Respiratory:  Positive for cough. Negative for chest tightness, shortness of breath and wheezing.   Cardiovascular:  Negative for chest pain, palpitations and leg swelling.  Gastrointestinal: Negative.   Neurological:  Positive for headaches. Negative for dizziness and numbness.  Psychiatric/Behavioral: Negative.      Per HPI unless specifically indicated above     Objective:    There were no vitals taken for this visit.  Wt Readings from Last 3 Encounters:  08/27/23 174 lb (78.9 kg)  06/12/23 172 lb 9.6 oz (78.3 kg)  12/10/22 177 lb 8 oz (80.5 kg)    Physical Exam Vitals and nursing note reviewed.  Constitutional:      General: She is awake. She is not in acute distress.    Appearance: She is well-developed and well-groomed. She is not ill-appearing or toxic-appearing.  HENT:     Head:  Normocephalic.     Right Ear: Hearing normal.     Left Ear: Hearing normal.  Eyes:     General: Lids are normal.        Right eye: No discharge.        Left eye: No discharge.     Conjunctiva/sclera: Conjunctivae normal.  Pulmonary:     Effort: Pulmonary effort is normal. No accessory muscle usage or respiratory  distress.  Musculoskeletal:     Cervical back: Normal range of motion.  Neurological:     Mental Status: She is alert and oriented to person, place, and time.  Psychiatric:        Attention and Perception: Attention normal.        Mood and Affect: Mood normal.        Behavior: Behavior normal. Behavior is cooperative.        Thought Content: Thought content normal.        Judgment: Judgment normal.   d  Results for orders placed or performed in visit on 08/27/23  Cologuard   Collection Time: 10/13/23  9:50 AM  Result Value Ref Range   COLOGUARD Negative Negative      Assessment & Plan:   Problem List Items Addressed This Visit       Respiratory   Acute frontal sinusitis - Primary   Acute for one week with symptoms not improving with OTC medications.  Negative Covid at home and is past dates for both Covid and flu treatment.  Start Zpack and Prednisone 40 MG daily for 5 days.  Recommend: - Increased rest - Increasing Fluids - Acetaminophen as needed for fever/pain.  - Salt water gargling, chloraseptic spray and throat lozenges - Mucinex.  - Saline sinus flushes or a neti pot.  - Humidifying the air.       Relevant Medications   azithromycin (ZITHROMAX) 250 MG tablet   predniSONE (DELTASONE) 20 MG tablet     Follow up plan: Return if symptoms worsen or fail to improve.

## 2023-11-06 NOTE — Patient Instructions (Signed)

## 2023-11-06 NOTE — Assessment & Plan Note (Signed)
Acute for one week with symptoms not improving with OTC medications.  Negative Covid at home and is past dates for both Covid and flu treatment.  Start Zpack and Prednisone 40 MG daily for 5 days.  Recommend: - Increased rest - Increasing Fluids - Acetaminophen as needed for fever/pain.  - Salt water gargling, chloraseptic spray and throat lozenges - Mucinex.  - Saline sinus flushes or a neti pot.  - Humidifying the air.

## 2023-12-03 ENCOUNTER — Encounter: Payer: Self-pay | Admitting: Nurse Practitioner

## 2023-12-08 NOTE — Patient Instructions (Signed)
 Be Involved in Caring For Your Health:  Taking Medications When medications are taken as directed, they can greatly improve your health. But if they are not taken as prescribed, they may not work. In some cases, not taking them correctly can be harmful. To help ensure your treatment remains effective and safe, understand your medications and how to take them. Bring your medications to each visit for review by your provider.  Your lab results, notes, and after visit summary will be available on My Chart. We strongly encourage you to use this feature. If lab results are abnormal the clinic will contact you with the appropriate steps. If the clinic does not contact you assume the results are satisfactory. You can always view your results on My Chart. If you have questions regarding your health or results, please contact the clinic during office hours. You can also ask questions on My Chart.  We at [facility] are grateful that you chose [name] to provide your care. We strive to provide evidence-based and compassionate care and are always looking for feedback. If you get a survey from the clinic please complete this so we can hear your opinions.  Healthy Eating, Adult Healthy eating may help you get and keep a healthy body weight, reduce the risk of chronic disease, and live a long and productive life. It is important to follow a healthy eating pattern. Your nutritional and calorie needs should be met mainly by different nutrient-rich foods. What are tips for following this plan? Reading food labels Read labels and choose the following: Reduced or low sodium products. Juices with 100% fruit juice. Foods with low saturated fats (<3 g per serving) and high polyunsaturated and monounsaturated fats. Foods with whole grains, such as whole wheat, cracked wheat, brown rice, and wild rice. Whole grains that are fortified with folic acid. This is recommended for females who are pregnant or who want  to become pregnant. Read labels and do not eat or drink the following: Foods or drinks with added sugars. These include foods that contain brown sugar, corn sweetener, corn syrup, dextrose, fructose, glucose, high-fructose corn syrup, honey, invert sugar, lactose, malt syrup, maltose, molasses, raw sugar, sucrose, trehalose, or turbinado sugar. Limit your intake of added sugars to less than 10% of your total daily calories. Do not eat more than the following amounts of added sugar per day: 6 teaspoons (25 g) for females. 9 teaspoons (38 g) for males. Foods that contain processed or refined starches and grains. Refined grain products, such as white flour, degermed cornmeal, white bread, and white rice. Shopping Choose nutrient-rich snacks, such as vegetables, whole fruits, and nuts. Avoid high-calorie and high-sugar snacks, such as potato chips, fruit snacks, and candy. Use oil-based dressings and spreads on foods instead of solid fats such as butter, margarine, sour cream, or cream cheese. Limit pre-made sauces, mixes, and "instant" products such as flavored rice, instant noodles, and ready-made pasta. Try more plant-protein sources, such as tofu, tempeh, black beans, edamame, lentils, nuts, and seeds. Explore eating plans such as the Mediterranean diet or vegetarian diet. Try heart-healthy dips made with beans and healthy fats like hummus and guacamole. Vegetables go great with these. Cooking Use oil to saut or stir-fry foods instead of solid fats such as butter, margarine, or lard. Try baking, boiling, grilling, or broiling instead of frying. Remove the fatty part of meats before cooking. Steam vegetables in water or broth. Meal planning  At meals, imagine dividing your plate into fourths: One-half of  your plate is fruits and vegetables. One-fourth of your plate is whole grains. One-fourth of your plate is protein, especially lean meats, poultry, eggs, tofu, beans, or nuts. Include  low-fat dairy as part of your daily diet. Lifestyle Choose healthy options in all settings, including home, work, school, restaurants, or stores. Prepare your food safely: Wash your hands after handling raw meats. Where you prepare food, keep surfaces clean by regularly washing with hot, soapy water. Keep raw meats separate from ready-to-eat foods, such as fruits and vegetables. Cook seafood, meat, poultry, and eggs to the recommended temperature. Get a food thermometer. Store foods at safe temperatures. In general: Keep cold foods at 76F (4.4C) or below. Keep hot foods at 176F (60C) or above. Keep your freezer at [facility] (-17.8C) or below. Foods are not safe to eat if they have been between the temperatures of 40-176F (4.4-60C) for more than 2 hours. What foods should I eat? Fruits Aim to eat 1-2 cups of fresh, canned (in natural juice), or frozen fruits each day. One cup of fruit equals 1 small apple, 1 large banana, 8 large strawberries, 1 cup (237 g) canned fruit,  cup (82 g) dried fruit, or 1 cup (240 mL) 100% juice. Vegetables Aim to eat 2-4 cups of fresh and frozen vegetables each day, including different varieties and colors. One cup of vegetables equals 1 cup (91 g) broccoli or cauliflower florets, 2 medium carrots, 2 cups (150 g) raw, leafy greens, 1 large tomato, 1 large bell pepper, 1 large sweet potato, or 1 medium white potato. Grains Aim to eat 5-10 ounce-equivalents of whole grains each day. Examples of 1 ounce-equivalent of grains include 1 slice of bread, 1 cup (40 g) ready-to-eat cereal, 3 cups (24 g) popcorn, or  cup (93 g) cooked rice. Meats and other proteins Try to eat 5-7 ounce-equivalents of protein each day. Examples of 1 ounce-equivalent of protein include 1 egg,  oz nuts (12 almonds, 24 pistachios, or 7 walnut halves), 1/4 cup (90 g) cooked beans, 6 tablespoons (90 g) hummus or 1 tablespoon (16 g) peanut butter. A cut of meat or fish that is the size of a deck  of cards is about 3-4 ounce-equivalents (85 g). Of the protein you eat each week, try to have at least 8 sounce (227 g) of seafood. This is about 2 servings per week. This includes salmon, trout, herring, sardines, and anchovies. Dairy Aim to eat 3 cup-equivalents of fat-free or low-fat dairy each day. Examples of 1 cup-equivalent of dairy include 1 cup (240 mL) milk, 8 ounces (250 g) yogurt, 1 ounces (44 g) natural cheese, or 1 cup (240 mL) fortified soy milk. Fats and oils Aim for about 5 teaspoons (21 g) of fats and oils per day. Choose monounsaturated fats, such as canola and olive oils, mayonnaise made with olive oil or avocado oil, avocados, peanut butter, and most nuts, or polyunsaturated fats, such as sunflower, corn, and soybean oils, walnuts, pine nuts, sesame seeds, sunflower seeds, and flaxseed. Beverages Aim for 6 eight-ounce glasses of water per day. Limit coffee to 3-5 eight-ounce cups per day. Limit caffeinated beverages that have added calories, such as soda and energy drinks. If you drink alcohol: Limit how much you have to: 0-1 drink a day if you are female. 0-2 drinks a day if you are female. Know how much alcohol is in your drink. In the [place] one drink is one 12 oz bottle of beer (355 mL), one 5 oz glass of wine (  148 mL), or one 1 oz glass of hard liquor (44 mL). Seasoning and other foods Try not to add too much salt to your food. Try using herbs and spices instead of salt. Try not to add sugar to food. This information is based on [place] nutrition guidelines. To learn more, visit [employer].be. Exact amounts may vary. You may need different amounts. This information is not intended to replace advice given to you by your health care provider. Make sure you discuss any questions you have with your health care provider. Document Revised: 06/04/2022 Document Reviewed: 06/04/2022 Elsevier Patient Education  2024 [employer].

## 2023-12-11 ENCOUNTER — Encounter: Payer: Self-pay | Admitting: Nurse Practitioner

## 2023-12-11 ENCOUNTER — Ambulatory Visit: Payer: Self-pay | Admitting: Nurse Practitioner

## 2023-12-11 VITALS — BP 129/82 | HR 85 | Temp 98.6°F | Ht 62.5 in | Wt 177.2 lb

## 2023-12-11 DIAGNOSIS — I10 Essential (primary) hypertension: Secondary | ICD-10-CM | POA: Diagnosis not present

## 2023-12-11 DIAGNOSIS — E782 Mixed hyperlipidemia: Secondary | ICD-10-CM

## 2023-12-11 DIAGNOSIS — F325 Major depressive disorder, single episode, in full remission: Secondary | ICD-10-CM | POA: Diagnosis not present

## 2023-12-11 DIAGNOSIS — E6609 Other obesity due to excess calories: Secondary | ICD-10-CM

## 2023-12-11 DIAGNOSIS — Z0000 Encounter for general adult medical examination without abnormal findings: Secondary | ICD-10-CM

## 2023-12-11 DIAGNOSIS — E559 Vitamin D deficiency, unspecified: Secondary | ICD-10-CM

## 2023-12-11 DIAGNOSIS — Z6831 Body mass index (BMI) 31.0-31.9, adult: Secondary | ICD-10-CM

## 2023-12-11 DIAGNOSIS — E66811 Obesity, class 1: Secondary | ICD-10-CM

## 2023-12-11 MED ORDER — WEGOVY 2.4 MG/0.75ML ~~LOC~~ SOAJ: 2.4000 mg | SUBCUTANEOUS | 2 refills | Status: DC

## 2023-12-11 MED ORDER — LOSARTAN POTASSIUM 25 MG PO TABS: 25.0000 mg | ORAL_TABLET | Freq: Every day | ORAL | 4 refills | Status: DC

## 2023-12-11 MED ORDER — LOSARTAN POTASSIUM 25 MG PO TABS: 25.0000 mg | ORAL_TABLET | Freq: Every day | ORAL | 4 refills | Status: AC

## 2023-12-11 MED ORDER — GEMFIBROZIL 600 MG PO TABS: ORAL_TABLET | ORAL | 17 refills | Status: AC

## 2023-12-11 MED ORDER — HYDROCHLOROTHIAZIDE 25 MG PO TABS: 25.0000 mg | ORAL_TABLET | Freq: Every day | ORAL | 4 refills | Status: AC

## 2023-12-11 MED ORDER — GEMFIBROZIL 600 MG PO TABS: ORAL_TABLET | ORAL | 17 refills | Status: DC

## 2023-12-11 MED ORDER — HYDROCHLOROTHIAZIDE 25 MG PO TABS: 25.0000 mg | ORAL_TABLET | Freq: Every day | ORAL | 4 refills | Status: DC

## 2023-12-11 NOTE — Assessment & Plan Note (Signed)
 BMI 31.89, taking Mounjaro and is no longer seeing benefit even on max dose.  She would like to try [facility].  Since on max dose of Mounjaro will start Wegovy at 2.4 MG as suspect she will tolerate this and will alert PCP if she does not.  No family history of thyroid cancer (MTC, MEN 2, thyroid cell tumors) or pancreatitis. Recommended eating smaller high protein, low fat meals more frequently and exercising 30 mins a day 5 times a week with a goal of 10-15lb weight loss in the next 3 months. Patient voiced their understanding and motivation to adhere to these recommendations.

## 2023-12-11 NOTE — Assessment & Plan Note (Signed)
Ongoing, will recheck Vit D level today.  Recommend to continue supplement daily. 

## 2023-12-11 NOTE — Progress Notes (Signed)
 BP 129/82   Pulse 85   Temp 98.6 F (37 C) (Oral)   Ht 5' 2.5" (1.588 m)   Wt 177 lb 3.2 oz (80.4 kg)   LMP 11/24/2023 (Approximate)   SpO2 98%   BMI 31.89 kg/m    Subjective:    Patient ID: Steffanie Rainwater, female    DOB: 04-Nov-1975, 48 y.o.   MRN: [id number]  HPI: Leslieanne Cobarrubias is a 48 y.o. female  Chief Complaint  Patient presents with   Depression   Hypertension   HYPERTENSION / HYPERLIPIDEMIA Taking Losartan, HCTZ, and Lopid.  Stopped taking Mounjaro, was not seeing much difference with this at 15 MG.  Goal is 140 lbs.  Was paying out of pocket for this.  Is interested in trying [facility].  Not exercising or focusing on diet, but plans on doing this.  Continues Vitamin D supplement at home. Satisfied with current treatment? yes Duration of hypertension: chronic BP monitoring frequency: not checking BP range:  BP medication side effects: no Duration of hyperlipidemia: chronic Cholesterol medication side effects: no Cholesterol supplements: none Medication compliance: good compliance Aspirin: no Recent stressors: no Recurrent headaches: no Visual changes: no Palpitations: no Dyspnea: no Chest pain: no Lower extremity edema: no Dizzy/lightheaded: no  The 10-year ASCVD risk score (Arnett DK, et al., 2019) is: 2.3%   Values used to calculate the score:     Age: 5 years     Sex: Female     Is Non-Hispanic African American: No     Diabetic: No     Tobacco smoker: No     Systolic Blood Pressure: 129 mmHg     Is BP treated: Yes     HDL Cholesterol: 46 mg/dL     Total Cholesterol: 214 mg/dL   DEPRESSION No current medications. Mood status: stable Satisfied with current treatment?: yes Symptom severity: mild  Previous psychiatric medications: Wellbutrin Depressed mood: no Anxious mood: occasional, manageable Anhedonia: no Significant weight loss or gain: no Insomnia: none Fatigue: no Feelings of worthlessness or guilt: no Impaired  concentration/indecisiveness: no Suicidal ideations: no Hopelessness: no Crying spells: no    12/11/2023    8:07 AM 06/12/2023    8:28 AM 12/10/2022    8:13 AM 07/17/2022    4:03 PM 12/08/2021    8:08 AM  Depression screen PHQ 2/9  Decreased Interest 0 0 0 0 0  Down, Depressed, Hopeless 0 0 0 0 0  PHQ - 2 Score 0 0 0 0 0  Altered sleeping 0 0 0 0 0  Tired, decreased energy 0 0 0 0 0  Change in appetite 0 0 1 0 0  Feeling bad or failure about yourself  0 0 0 0 0  Trouble concentrating 0 0 0 0 0  Moving slowly or fidgety/restless 0 0 0 0 0  Suicidal thoughts 0 0 0 0 0  PHQ-9 Score 0 0 1 0 0  Difficult doing work/chores Not difficult at all Not difficult at all Not difficult at all         12/11/2023    8:07 AM 06/12/2023    8:28 AM 12/10/2022    8:14 AM 07/17/2022    4:03 PM  GAD 7 : Generalized Anxiety Score  Nervous, Anxious, on Edge 1 0 0 0  Control/stop worrying 0 0 0 0  Worry too much - different things 1 0 0 0  Trouble relaxing 0 0 0 0  Restless 0 0 0 0  Easily annoyed or irritable  0 0 0 0  Afraid - awful might happen 1 0 0 0  Total GAD 7 Score 3 0 0 0  Anxiety Difficulty Not difficult at all Not difficult at all Not difficult at all     Relevant past medical, surgical, family and social history reviewed and updated as indicated. Interim medical history since our last visit reviewed. Allergies and medications reviewed and updated.  Review of Systems  Constitutional:  Negative for activity change, appetite change, diaphoresis, fatigue and fever.  HENT:  Negative for mouth sores.   Respiratory:  Negative for cough, chest tightness and shortness of breath.   Cardiovascular:  Negative for chest pain, palpitations and leg swelling.  Gastrointestinal: Negative.   Endocrine: Negative.   Neurological: Negative.   Psychiatric/Behavioral: Negative.      Per HPI unless specifically indicated above     Objective:    BP 129/82   Pulse 85   Temp 98.6 F (37 C) (Oral)    Ht 5' 2.5" (1.588 m)   Wt 177 lb 3.2 oz (80.4 kg)   LMP 11/24/2023 (Approximate)   SpO2 98%   BMI 31.89 kg/m   Wt Readings from Last 3 Encounters:  12/11/23 177 lb 3.2 oz (80.4 kg)  08/27/23 174 lb (78.9 kg)  06/12/23 172 lb 9.6 oz (78.3 kg)    Physical Exam Vitals and nursing note reviewed.  Constitutional:      General: She is awake. She is not in acute distress.    Appearance: She is well-developed and well-groomed. She is obese. She is not ill-appearing or toxic-appearing.  HENT:     Head: Normocephalic.     Right Ear: Hearing normal.     Left Ear: Hearing normal.  Eyes:     General: Lids are normal.        Right eye: No discharge.        Left eye: No discharge.     Conjunctiva/sclera: Conjunctivae normal.     Pupils: Pupils are equal, round, and reactive to light.  Neck:     Thyroid: No thyromegaly.     Vascular: No carotid bruit.  Cardiovascular:     Rate and Rhythm: Normal rate and regular rhythm.     Heart sounds: Normal heart sounds. No murmur heard.    No gallop.  Pulmonary:     Effort: Pulmonary effort is normal. No accessory muscle usage or respiratory distress.     Breath sounds: Normal breath sounds.  Abdominal:     General: Bowel sounds are normal.     Palpations: Abdomen is soft.  Musculoskeletal:     Cervical back: Normal range of motion and neck supple.     Right lower leg: No edema.     Left lower leg: No edema.  Skin:    General: Skin is warm and dry.  Neurological:     Mental Status: She is alert and oriented to person, place, and time.  Psychiatric:        Attention and Perception: Attention normal.        Mood and Affect: Mood normal.        Behavior: Behavior normal. Behavior is cooperative.        Thought Content: Thought content normal.        Judgment: Judgment normal.    Results for orders placed or performed in visit on 08/27/23  Cologuard   Collection Time: 10/13/23  9:50 AM  Result Value Ref Range   COLOGUARD Negative  Negative  Assessment & Plan:   Problem List Items Addressed This Visit       Cardiovascular and Mediastinum   Essential hypertension   Chronic, stable.  BP remains at goal in office.  Recommend she monitor BP at least a few mornings a week at home and document.  DASH diet at home.  Continue current medication regimen and adjust as needed.  Labs today: CBC, TSH, CMP.  Return in 6 months.       Relevant Medications   hydrochlorothiazide (HYDRODIURIL) 25 MG tablet   losartan (COZAAR) 25 MG tablet   gemfibrozil (LOPID) 600 MG tablet   Other Relevant Orders   CBC with Differential/Platelet   TSH     Other   Depression, major, single episode, complete remission (HCC) - Primary   Remains in remission, no medications.  Denies SI/HI.  Restart Wellbutrin as needed in future.  PHQ9 - 0 today.  Return in 6 months.      Hyperlipidemia   Chronic, ongoing.  Continue current medication regimen and adjust as needed.  Lipid panel today. Return in 6 months.      Relevant Medications   hydrochlorothiazide (HYDRODIURIL) 25 MG tablet   losartan (COZAAR) 25 MG tablet   gemfibrozil (LOPID) 600 MG tablet   Other Relevant Orders   Comprehensive metabolic panel   Lipid Panel w/o Chol/HDL Ratio   Obesity   BMI 16.10, taking Mounjaro and is no longer seeing benefit even on max dose.  She would like to try [facility].  Since on max dose of Mounjaro will start Wegovy at 2.4 MG as suspect she will tolerate this and will alert PCP if she does not.  No family history of thyroid cancer (MTC, MEN 2, thyroid cell tumors) or pancreatitis. Recommended eating smaller high protein, low fat meals more frequently and exercising 30 mins a day 5 times a week with a goal of 10-15lb weight loss in the next 3 months. Patient voiced their understanding and motivation to adhere to these recommendations.         Relevant Medications   Semaglutide-Weight Management (WEGOVY) 2.4 MG/0.75ML SOAJ   Vitamin D deficiency    Ongoing, will recheck Vit D level today.  Recommend to continue supplement daily.      Relevant Orders   VITAMIN D 25 Hydroxy (Vit-D Deficiency, Fractures)     Follow up plan: Return in about 6 months (around 06/12/2024) for HTN/HLD.

## 2023-12-11 NOTE — Assessment & Plan Note (Signed)
Remains in remission, no medications.  Denies SI/HI.  Restart Wellbutrin as needed in future.  PHQ9 - 0 today.  Return in 6 months.

## 2023-12-11 NOTE — Assessment & Plan Note (Signed)
 Chronic, stable.  BP remains at goal in office.  Recommend she monitor BP at least a few mornings a week at home and document.  DASH diet at home.  Continue current medication regimen and adjust as needed.  Labs today: CBC, TSH, CMP.  Return in 6 months.

## 2023-12-11 NOTE — Assessment & Plan Note (Signed)
Chronic, ongoing.  Continue current medication regimen and adjust as needed.  Lipid panel today.  Return in 6 months. 

## 2023-12-12 ENCOUNTER — Encounter: Payer: Self-pay | Admitting: Nurse Practitioner

## 2023-12-12 LAB — COMPREHENSIVE METABOLIC PANEL WITH GFR
ALT: 10 IU/L (ref 0–32)
AST: 15 IU/L (ref 0–40)
Albumin: 4.2 g/dL (ref 3.9–4.9)
Alkaline Phosphatase: 63 IU/L (ref 44–121)
BUN/Creatinine Ratio: 20 (ref 9–23)
BUN: 13 mg/dL (ref 6–24)
Bilirubin Total: 0.3 mg/dL (ref 0.0–1.2)
CO2: 24 mmol/L (ref 20–29)
Calcium: 9.8 mg/dL (ref 8.7–10.2)
Chloride: 100 mmol/L (ref 96–106)
Creatinine, Ser: 0.66 mg/dL (ref 0.57–1.00)
Globulin, Total: 2.6 g/dL (ref 1.5–4.5)
Glucose: 81 mg/dL (ref 70–99)
Potassium: 4 mmol/L (ref 3.5–5.2)
Sodium: 140 mmol/L (ref 134–144)
Total Protein: 6.8 g/dL (ref 6.0–8.5)
eGFR: 107 mL/min/{1.73_m2} (ref >59)

## 2023-12-12 LAB — CBC WITH DIFFERENTIAL/PLATELET
Basophils Absolute: 0 10*3/uL (ref 0.0–0.2)
Basos: 0 %
EOS (ABSOLUTE): 0.2 10*3/uL (ref 0.0–0.4)
Eos: 2 %
Hematocrit: 38.5 % (ref 34.0–46.6)
Hemoglobin: 12.7 g/dL (ref 11.1–15.9)
Immature Grans (Abs): 0.1 10*3/uL (ref 0.0–0.1)
Immature Granulocytes: 1 %
Lymphocytes Absolute: 3.3 10*3/uL — ABNORMAL HIGH (ref 0.7–3.1)
Lymphs: 32 %
MCH: 29.9 pg (ref 26.6–33.0)
MCHC: 33 g/dL (ref 31.5–35.7)
MCV: 91 fL (ref 79–97)
Monocytes Absolute: 0.6 10*3/uL (ref 0.1–0.9)
Monocytes: 6 %
Neutrophils Absolute: 6.2 10*3/uL (ref 1.4–7.0)
Neutrophils: 59 %
Platelets: 493 10*3/uL — ABNORMAL HIGH (ref 150–450)
RBC: 4.25 x10E6/uL (ref 3.77–5.28)
RDW: 14.2 % (ref 11.7–15.4)
WBC: 10.4 10*3/uL (ref 3.4–10.8)

## 2023-12-12 LAB — TSH: TSH: 2.19 u[IU]/mL (ref 0.450–4.500)

## 2023-12-12 LAB — LIPID PANEL W/O CHOL/HDL RATIO
Cholesterol, Total: 223 mg/dL — ABNORMAL HIGH (ref 100–199)
HDL: 50 mg/dL (ref >39)
LDL Chol Calc (NIH): 129 mg/dL — ABNORMAL HIGH (ref 0–99)
Triglycerides: 251 mg/dL — ABNORMAL HIGH (ref 0–149)
VLDL Cholesterol Cal: 44 mg/dL — ABNORMAL HIGH (ref 5–40)

## 2023-12-12 LAB — VITAMIN D 25 HYDROXY (VIT D DEFICIENCY, FRACTURES): Vit D, 25-Hydroxy: 21 ng/mL — ABNORMAL LOW (ref 30.0–100.0)

## 2023-12-12 NOTE — Progress Notes (Signed)
 Contacted via MyChart   Good evening Julia Campbell, your labs have returned: - CBC show mild elevation in platelets and lymphocytes (a type of white blood cell, could be up a little if you were sick recently).  We will continue to monitor these. - Lipid panel continues to show elevation, continue medication. - Vitamin D level remains low, please ensure you are taking Vitamin D3 2000 units daily. - Remainder of labs stable.  Any questions? Keep being amazing!!  Thank you for allowing me to participate in your care.  I appreciate you. Kindest regards, Cyncere Sontag

## 2024-02-03 ENCOUNTER — Encounter: Payer: Self-pay | Admitting: Nurse Practitioner

## 2024-02-04 ENCOUNTER — Ambulatory Visit
Admission: RE | Admit: 2024-02-04 | Discharge: 2024-02-04 | Disposition: A | Discharge status: Home / Self Care | Source: Ambulatory Visit | Attending: Obstetrics and Gynecology | Admitting: Obstetrics and Gynecology

## 2024-02-04 DIAGNOSIS — Z1231 Encounter for screening mammogram for malignant neoplasm of breast: Secondary | ICD-10-CM | POA: Diagnosis present

## 2024-02-04 MED ORDER — WEGOVY 2.4 MG/0.75ML ~~LOC~~ SOAJ: 2.4000 mg | SUBCUTANEOUS | 2 refills | Status: DC

## 2024-02-09 ENCOUNTER — Ambulatory Visit: Payer: Self-pay | Admitting: Obstetrics and Gynecology

## 2024-04-06 ENCOUNTER — Encounter: Payer: Self-pay | Admitting: Nurse Practitioner

## 2024-04-20 ENCOUNTER — Telehealth (INDEPENDENT_AMBULATORY_CARE_PROVIDER_SITE_OTHER): Admitting: Nurse Practitioner

## 2024-04-20 ENCOUNTER — Encounter: Payer: Self-pay | Admitting: Nurse Practitioner

## 2024-04-20 DIAGNOSIS — Z6831 Body mass index (BMI) 31.0-31.9, adult: Secondary | ICD-10-CM

## 2024-04-20 DIAGNOSIS — E66811 Obesity, class 1: Secondary | ICD-10-CM

## 2024-04-20 DIAGNOSIS — E88819 Insulin resistance, unspecified: Secondary | ICD-10-CM | POA: Insufficient documentation

## 2024-04-20 DIAGNOSIS — E6609 Other obesity due to excess calories: Secondary | ICD-10-CM

## 2024-04-20 MED ORDER — METFORMIN HCL 500 MG PO TABS
ORAL_TABLET | ORAL | 1 refills | Status: DC
Start: 2024-04-20 — End: 2024-06-15

## 2024-04-20 NOTE — Assessment & Plan Note (Signed)
 Took Metformin  in past and tolerated this, obtained via GYN.  Is not tolerating injections and self stopped.  Will restart this to assist with weight loss and insulin resistance.

## 2024-04-20 NOTE — Progress Notes (Signed)
 There were no vitals taken for this visit.   Subjective:    Patient ID: Julia Campbell, female    DOB: 12-26-1975, 48 y.o.   MRN: [id number]  HPI: Julia Campbell is a 48 y.o. female  Chief Complaint  Patient presents with   Weight Check    Patient states she stopped taking Wegovy  about 3 months ago due to not liking how it was making her feel. Patient states she would like to discuss Metformin  instead.    Virtual Visit via Video Note  I connected with Julia Campbell on 04/20/24 at 10:00 AM EDT by a video enabled telemedicine application and verified that I am speaking with the correct person using two identifiers.  Location: Patient: work Restaurant manager, fast food: work   I discussed the limitations of evaluation and management by telemedicine and the availability of in person appointments. The patient expressed understanding and agreed to proceed.  I provided 25 minutes of non-face-to-face time during this encounter.   Julia Campbell T Jasalyn Frysinger, NP   WEIGHT GAIN Stopped Wegovy  on her own about 3 months ago, was having side effects - gagging and nausea.  Mounjaro  started to have nausea with it in past. Duration: chronic Previous attempts at weight loss: yes Complications of obesity: HTN and HLD Peak weight: 197 lbs Weight loss goal: 140 lbs Weight loss to date: none Requesting obesity pharmacotherapy: yes Current weight loss supplements/medications: yes Previous weight loss supplements/meds: yes Calories:  2000  Relevant past medical, surgical, family and social history reviewed and updated as indicated. Interim medical history since our last visit reviewed. Allergies and medications reviewed and updated.  Review of Systems  Constitutional:  Negative for activity change, appetite change, diaphoresis, fatigue and fever.  Respiratory:  Negative for cough, chest tightness and shortness of breath.   Cardiovascular:  Negative for chest pain, palpitations and leg swelling.  Gastrointestinal:  Negative.   Neurological: Negative.   Psychiatric/Behavioral: Negative.      Per HPI unless specifically indicated above     Objective:    There were no vitals taken for this visit.  Wt Readings from Last 3 Encounters:  12/11/23 177 lb 3.2 oz (80.4 kg)  08/27/23 174 lb (78.9 kg)  06/12/23 172 lb 9.6 oz (78.3 kg)    Physical Exam Vitals and nursing note reviewed.  Constitutional:      General: She is awake. She is not in acute distress.    Appearance: She is well-developed and well-groomed. She is obese. She is not ill-appearing.  HENT:     Head: Normocephalic.     Right Ear: Hearing normal.     Left Ear: Hearing normal.  Eyes:     General: Lids are normal.        Right eye: No discharge.        Left eye: No discharge.     Conjunctiva/sclera: Conjunctivae normal.  Pulmonary:     Effort: Pulmonary effort is normal. No accessory muscle usage or respiratory distress.  Musculoskeletal:     Cervical back: Normal range of motion.  Neurological:     Mental Status: She is alert and oriented to person, place, and time.  Psychiatric:        Attention and Perception: Attention normal.        Mood and Affect: Mood normal.        Behavior: Behavior normal. Behavior is cooperative.        Thought Content: Thought content normal.        Judgment: Judgment normal.  Results for orders placed or performed in visit on 12/11/23  Comprehensive metabolic panel   Collection Time: 12/11/23  8:25 AM  Result Value Ref Range   Glucose 81 70 - 99 mg/dL   BUN 13 6 - 24 mg/dL   Creatinine, Ser 9.33 0.57 - 1.00 mg/dL   eGFR 892 >40 fO/fpw/8.26   BUN/Creatinine Ratio 20 9 - 23   Sodium 140 134 - 144 mmol/L   Potassium 4.0 3.5 - 5.2 mmol/L   Chloride 100 96 - 106 mmol/L   CO2 24 20 - 29 mmol/L   Calcium 9.8 8.7 - 10.2 mg/dL   Total Protein 6.8 6.0 - 8.5 g/dL   Albumin 4.2 3.9 - 4.9 g/dL   Globulin, Total 2.6 1.5 - 4.5 g/dL   Bilirubin Total 0.3 0.0 - 1.2 mg/dL   Alkaline Phosphatase  63 44 - 121 IU/L   AST 15 0 - 40 IU/L   ALT 10 0 - 32 IU/L  CBC with Differential/Platelet   Collection Time: 12/11/23  8:25 AM  Result Value Ref Range   WBC 10.4 3.4 - 10.8 x10E3/uL   RBC 4.25 3.77 - 5.28 x10E6/uL   Hemoglobin 12.7 11.1 - 15.9 g/dL   Hematocrit 61.4 65.9 - 46.6 %   MCV 91 79 - 97 fL   MCH 29.9 26.6 - 33.0 pg   MCHC 33.0 31.5 - 35.7 g/dL   RDW 85.7 88.2 - 84.5 %   Platelets 493 (H) 150 - 450 x10E3/uL   Neutrophils 59 Not Estab. %   Lymphs 32 Not Estab. %   Monocytes 6 Not Estab. %   Eos 2 Not Estab. %   Basos 0 Not Estab. %   Neutrophils Absolute 6.2 1.4 - 7.0 x10E3/uL   Lymphocytes Absolute 3.3 (H) 0.7 - 3.1 x10E3/uL   Monocytes Absolute 0.6 0.1 - 0.9 x10E3/uL   EOS (ABSOLUTE) 0.2 0.0 - 0.4 x10E3/uL   Basophils Absolute 0.0 0.0 - 0.2 x10E3/uL   Immature Granulocytes 1 Not Estab. %   Immature Grans (Abs) 0.1 0.0 - 0.1 x10E3/uL  Lipid Panel w/o Chol/HDL Ratio   Collection Time: 12/11/23  8:25 AM  Result Value Ref Range   Cholesterol, Total 223 (H) 100 - 199 mg/dL   Triglycerides 748 (H) 0 - 149 mg/dL   HDL 50 >60 mg/dL   VLDL Cholesterol Cal 44 (H) 5 - 40 mg/dL   LDL Chol Calc (NIH) 870 (H) 0 - 99 mg/dL  TSH   Collection Time: 12/11/23  8:25 AM  Result Value Ref Range   TSH 2.190 0.450 - 4.500 uIU/mL  VITAMIN D  25 Hydroxy (Vit-D Deficiency, Fractures)   Collection Time: 12/11/23  8:25 AM  Result Value Ref Range   Vit D, 25-Hydroxy 21.0 (L) 30.0 - 100.0 ng/mL      Assessment & Plan:   Problem List Items Addressed This Visit       Endocrine   Insulin resistance - Primary   Took Metformin  in past and tolerated this, obtained via GYN.  Is not tolerating injections and self stopped.  Will restart this to assist with weight loss and insulin resistance.        Other   Obesity   BMI 31.89 last visit. She did not tolerate Wegovy  or Mounjaro . Restart Metformin  which she tolerated well in past.  Recommended eating smaller high protein, low fat meals more  frequently and exercising 30 mins a day 5 times a week with a goal of 10-15lb weight loss  in the next 3 months. Patient voiced their understanding and motivation to adhere to these recommendations.         Relevant Medications   metFORMIN  (GLUCOPHAGE ) 500 MG tablet     Follow up plan: Return for as scheduled in September.SABRA

## 2024-04-20 NOTE — Assessment & Plan Note (Signed)
 BMI 31.89 last visit. She did not tolerate Wegovy  or Mounjaro . Restart Metformin  which she tolerated well in past.  Recommended eating smaller high protein, low fat meals more frequently and exercising 30 mins a day 5 times a week with a goal of 10-15lb weight loss in the next 3 months. Patient voiced their understanding and motivation to adhere to these recommendations.

## 2024-04-20 NOTE — Patient Instructions (Signed)

## 2024-06-14 NOTE — Patient Instructions (Signed)
 Be Involved in Caring For Your Health:  Taking Medications When medications are taken as directed, they can greatly improve your health. But if they are not taken as prescribed, they may not work. In some cases, not taking them correctly can be harmful. To help ensure your treatment remains effective and safe, understand your medications and how to take them. Bring your medications to each visit for review by your provider.  Your lab results, notes, and after visit summary will be available on My Chart. We strongly encourage you to use this feature. If lab results are abnormal the clinic will contact you with the appropriate steps. If the clinic does not contact you assume the results are satisfactory. You can always view your results on My Chart. If you have questions regarding your health or results, please contact the clinic during office hours. You can also ask questions on My Chart.  We at [facility] are grateful that you chose us  to provide your care. We strive to provide evidence-based and compassionate care and are always looking for feedback. If you get a survey from the clinic please complete this so we can hear your opinions.  Healthy Eating, Adult Healthy eating may help you get and keep a healthy body weight, reduce the risk of chronic disease, and live a long and productive life. It is important to follow a healthy eating pattern. Your nutritional and calorie needs should be met mainly by different nutrient-rich foods. What are tips for following this plan? Reading food labels Read labels and choose the following: Reduced or low sodium products. Juices with 100% fruit juice. Foods with low saturated fats (<3 g per serving) and high polyunsaturated and monounsaturated fats. Foods with whole grains, such as whole wheat, cracked wheat, brown rice, and wild rice. Whole grains that are fortified with folic acid. This is recommended for females who are pregnant or who want to  become pregnant. Read labels and do not eat or drink the following: Foods or drinks with added sugars. These include foods that contain brown sugar, corn sweetener, corn syrup, dextrose , fructose, glucose, high-fructose corn syrup, honey, invert sugar, lactose, malt syrup, maltose, molasses, raw sugar, sucrose, trehalose, or turbinado sugar. Limit your intake of added sugars to less than 10% of your total daily calories. Do not eat more than the following amounts of added sugar per day: 6 teaspoons (25 g) for females. 9 teaspoons (38 g) for males. Foods that contain processed or refined starches and grains. Refined grain products, such as white flour, degermed cornmeal, white bread, and white rice. Shopping Choose nutrient-rich snacks, such as vegetables, whole fruits, and nuts. Avoid high-calorie and high-sugar snacks, such as potato chips, fruit snacks, and candy. Use oil-based dressings and spreads on foods instead of solid fats such as butter, margarine, sour cream, or cream cheese. Limit pre-made sauces, mixes, and instant products such as flavored rice, instant noodles, and ready-made pasta. Try more plant-protein sources, such as tofu, tempeh, black beans, edamame, lentils, nuts, and seeds. Explore eating plans such as the Mediterranean diet or vegetarian diet. Try heart-healthy dips made with beans and healthy fats like hummus and guacamole. Vegetables go great with these. Cooking Use oil to saut or stir-fry foods instead of solid fats such as butter, margarine, or lard. Try baking, boiling, grilling, or broiling instead of frying. Remove the fatty part of meats before cooking. Steam vegetables in water  or broth. Meal planning  At meals, imagine dividing your plate into fourths: One-half of  your plate is fruits and vegetables. One-fourth of your plate is whole grains. One-fourth of your plate is protein, especially lean meats, poultry, eggs, tofu, beans, or nuts. Include low-fat  dairy as part of your daily diet. Lifestyle Choose healthy options in all settings, including home, work, school, restaurants, or stores. Prepare your food safely: Wash your hands after handling raw meats. Where you prepare food, keep surfaces clean by regularly washing with hot, soapy water . Keep raw meats separate from ready-to-eat foods, such as fruits and vegetables. Cook seafood, meat, poultry, and eggs to the recommended temperature. Get a food thermometer. Store foods at safe temperatures. In general: Keep cold foods at 84F (4.4C) or below. Keep hot foods at 184F (60C) or above. Keep your freezer at [facility] (-17.8C) or below. Foods are not safe to eat if they have been between the temperatures of 40-184F (4.4-60C) for more than 2 hours. What foods should I eat? Fruits Aim to eat 1-2 cups of fresh, canned (in natural juice), or frozen fruits each day. One cup of fruit equals 1 small apple, 1 large banana, 8 large strawberries, 1 cup (237 g) canned fruit,  cup (82 g) dried fruit, or 1 cup (240 mL) 100% juice. Vegetables Aim to eat 2-4 cups of fresh and frozen vegetables each day, including different varieties and colors. One cup of vegetables equals 1 cup (91 g) broccoli or cauliflower florets, 2 medium carrots, 2 cups (150 g) raw, leafy greens, 1 large tomato, 1 large bell pepper, 1 large sweet potato, or 1 medium white potato. Grains Aim to eat 5-10 ounce-equivalents of whole grains each day. Examples of 1 ounce-equivalent of grains include 1 slice of bread, 1 cup (40 g) ready-to-eat cereal, 3 cups (24 g) popcorn, or  cup (93 g) cooked rice. Meats and other proteins Try to eat 5-7 ounce-equivalents of protein each day. Examples of 1 ounce-equivalent of protein include 1 egg,  oz nuts (12 almonds, 24 pistachios, or 7 walnut halves), 1/4 cup (90 g) cooked beans, 6 tablespoons (90 g) hummus or 1 tablespoon (16 g) peanut butter. A cut of meat or fish that is the size of a deck of  cards is about 3-4 ounce-equivalents (85 g). Of the protein you eat each week, try to have at least 8 sounce (227 g) of seafood. This is about 2 servings per week. This includes salmon, trout, herring, sardines, and anchovies. Dairy Aim to eat 3 cup-equivalents of fat-free or low-fat dairy each day. Examples of 1 cup-equivalent of dairy include 1 cup (240 mL) milk, 8 ounces (250 g) yogurt, 1 ounces (44 g) natural cheese, or 1 cup (240 mL) fortified soy milk. Fats and oils Aim for about 5 teaspoons (21 g) of fats and oils per day. Choose monounsaturated fats, such as canola and olive oils, mayonnaise made with olive oil or avocado oil, avocados, peanut butter, and most nuts, or polyunsaturated fats, such as sunflower, corn, and soybean oils, walnuts, pine nuts, sesame seeds, sunflower seeds, and flaxseed. Beverages Aim for 6 eight-ounce glasses of water  per day. Limit coffee to 3-5 eight-ounce cups per day. Limit caffeinated beverages that have added calories, such as soda and energy drinks. If you drink alcohol: Limit how much you have to: 0-1 drink a day if you are female. 0-2 drinks a day if you are female. Know how much alcohol is in your drink. In the [place] one drink is one 12 oz bottle of beer (355 mL), one 5 oz glass of wine (  148 mL), or one 1 oz glass of hard liquor (44 mL). Seasoning and other foods Try not to add too much salt to your food. Try using herbs and spices instead of salt. Try not to add sugar to food. This information is based on [place] nutrition guidelines. To learn more, visit [employer].be. Exact amounts may vary. You may need different amounts. This information is not intended to replace advice given to you by your health care provider. Make sure you discuss any questions you have with your health care provider. Document Revised: 06/04/2022 Document Reviewed: 06/04/2022 Elsevier Patient Education  2024 [employer].

## 2024-06-15 ENCOUNTER — Ambulatory Visit: Admitting: Nurse Practitioner

## 2024-06-15 ENCOUNTER — Encounter: Payer: Self-pay | Admitting: Nurse Practitioner

## 2024-06-15 VITALS — BP 126/78 | HR 85 | Ht 62.5 in | Wt 188.4 lb

## 2024-06-15 DIAGNOSIS — F325 Major depressive disorder, single episode, in full remission: Secondary | ICD-10-CM | POA: Diagnosis not present

## 2024-06-15 DIAGNOSIS — Z23 Encounter for immunization: Secondary | ICD-10-CM

## 2024-06-15 DIAGNOSIS — E6609 Other obesity due to excess calories: Secondary | ICD-10-CM

## 2024-06-15 DIAGNOSIS — E88819 Insulin resistance, unspecified: Secondary | ICD-10-CM | POA: Diagnosis not present

## 2024-06-15 DIAGNOSIS — Z6831 Body mass index (BMI) 31.0-31.9, adult: Secondary | ICD-10-CM

## 2024-06-15 DIAGNOSIS — E782 Mixed hyperlipidemia: Secondary | ICD-10-CM | POA: Diagnosis not present

## 2024-06-15 DIAGNOSIS — I10 Essential (primary) hypertension: Secondary | ICD-10-CM | POA: Diagnosis not present

## 2024-06-15 DIAGNOSIS — E66811 Obesity, class 1: Secondary | ICD-10-CM

## 2024-06-15 MED ORDER — METFORMIN HCL 500 MG PO TABS: 1000.0000 mg | ORAL_TABLET | Freq: Two times a day (BID) | ORAL | 2 refills | Status: AC

## 2024-06-15 NOTE — Progress Notes (Signed)
 BP 126/78 (BP Location: Left Arm, Patient Position: Sitting, Cuff Size: Large)   Pulse 85   Ht 5' 2.5 (1.588 m)   Wt 188 lb 6.4 oz (85.5 kg)   SpO2 99%   BMI 33.91 kg/m    Subjective:    Patient ID: Julia Campbell, female    DOB: 11/02/1975, 48 y.o.   MRN: [id number]  HPI: Julia Campbell is a 48 y.o. female  Chief Complaint  Patient presents with   Hyperlipidemia   Hypertension    Has questions regarding shingles and RSV vaccine, would also like to know at what age can you can take low dose aspirin   HYPERTENSION / HYPERLIPIDEMIA Taking Losartan , HCTZ, and Lopid  (takes once a day only).  Takes Metformin  for weight loss, 1000 MG at night only.  Took Mounjaro  and Wegovy  in the past, but did not like side effects and were costly out of pocket. Satisfied with current treatment? yes Duration of hypertension: chronic BP monitoring frequency: not checking BP range:  BP medication side effects: no Duration of hyperlipidemia: chronic Cholesterol medication side effects: no Cholesterol supplements: none Medication compliance: good compliance Aspirin: no Recent stressors: no Recurrent headaches: no Visual changes: no Palpitations: no Dyspnea: no Chest pain: no Lower extremity edema: no Dizzy/lightheaded: no  The 10-year ASCVD risk score (Arnett DK, et al., 2019) is: 2.1%   Values used to calculate the score:     Age: 52 years     Clincally relevant sex: Female     Is Non-Hispanic African American: No     Diabetic: No     Tobacco smoker: No     Systolic Blood Pressure: 126 mmHg     Is BP treated: Yes     HDL Cholesterol: 50 mg/dL     Total Cholesterol: 223 mg/dL   DEPRESSION No current medications. Mood status: stable Satisfied with current treatment?: yes Symptom severity: mild  Previous psychiatric medications: Wellbutrin  Depressed mood: occasional Anxious mood: occasional Anhedonia: no Significant weight loss or gain: no Insomnia: none Fatigue: towards the  evening Feelings of worthlessness or guilt: no Impaired concentration/indecisiveness: no Suicidal ideations: no Hopelessness: no Crying spells: no    06/15/2024    8:40 AM 12/11/2023    8:07 AM 06/12/2023    8:28 AM 12/10/2022    8:13 AM 07/17/2022    4:03 PM  Depression screen PHQ 2/9  Decreased Interest 0 0 0 0 0  Down, Depressed, Hopeless 1 0 0 0 0  PHQ - 2 Score 1 0 0 0 0  Altered sleeping 0 0 0 0 0  Tired, decreased energy 1 0 0 0 0  Change in appetite 1 0 0 1 0  Feeling bad or failure about yourself  1 0 0 0 0  Trouble concentrating 0 0 0 0 0  Moving slowly or fidgety/restless 0 0 0 0 0  Suicidal thoughts 0 0 0 0 0  PHQ-9 Score 4 0 0 1 0  Difficult doing work/chores  Not difficult at all Not difficult at all Not difficult at all        06/15/2024    8:40 AM 12/11/2023    8:07 AM 06/12/2023    8:28 AM 12/10/2022    8:14 AM  GAD 7 : Generalized Anxiety Score  Nervous, Anxious, on Edge 0 1 0 0  Control/stop worrying 0 0 0 0  Worry too much - different things 1 1 0 0  Trouble relaxing 0 0 0 0  Restless  0 0 0 0  Easily annoyed or irritable 0 0 0 0  Afraid - awful might happen 1 1 0 0  Total GAD 7 Score 2 3 0 0  Anxiety Difficulty  Not difficult at all Not difficult at all Not difficult at all   Relevant past medical, surgical, family and social history reviewed and updated as indicated. Interim medical history since our last visit reviewed. Allergies and medications reviewed and updated.  Review of Systems  Constitutional:  Negative for activity change, appetite change, diaphoresis, fatigue and fever.  HENT:  Negative for mouth sores.   Respiratory:  Negative for cough, chest tightness and shortness of breath.   Cardiovascular:  Negative for chest pain, palpitations and leg swelling.  Gastrointestinal: Negative.   Endocrine: Negative.   Neurological: Negative.   Psychiatric/Behavioral: Negative.      Per HPI unless specifically indicated above     Objective:     BP 126/78 (BP Location: Left Arm, Patient Position: Sitting, Cuff Size: Large)   Pulse 85   Ht 5' 2.5 (1.588 m)   Wt 188 lb 6.4 oz (85.5 kg)   SpO2 99%   BMI 33.91 kg/m   Wt Readings from Last 3 Encounters:  06/15/24 188 lb 6.4 oz (85.5 kg)  12/11/23 177 lb 3.2 oz (80.4 kg)  08/27/23 174 lb (78.9 kg)    Physical Exam Vitals and nursing note reviewed.  Constitutional:      General: She is awake. She is not in acute distress.    Appearance: She is well-developed and well-groomed. She is obese. She is not ill-appearing or toxic-appearing.  HENT:     Head: Normocephalic.     Right Ear: Hearing normal.     Left Ear: Hearing normal.  Eyes:     General: Lids are normal.        Right eye: No discharge.        Left eye: No discharge.     Conjunctiva/sclera: Conjunctivae normal.     Pupils: Pupils are equal, round, and reactive to light.  Neck:     Thyroid : No thyromegaly.     Vascular: No carotid bruit.  Cardiovascular:     Rate and Rhythm: Normal rate and regular rhythm.     Heart sounds: Normal heart sounds. No murmur heard.    No gallop.  Pulmonary:     Effort: Pulmonary effort is normal. No accessory muscle usage or respiratory distress.     Breath sounds: Normal breath sounds.  Abdominal:     General: Bowel sounds are normal.     Palpations: Abdomen is soft.  Musculoskeletal:     Cervical back: Normal range of motion and neck supple.     Right lower leg: No edema.     Left lower leg: No edema.  Skin:    General: Skin is warm and dry.  Neurological:     Mental Status: She is alert and oriented to person, place, and time.  Psychiatric:        Attention and Perception: Attention normal.        Mood and Affect: Mood normal.        Behavior: Behavior normal. Behavior is cooperative.        Thought Content: Thought content normal.        Judgment: Judgment normal.    Results for orders placed or performed in visit on 12/11/23  Comprehensive metabolic panel    Collection Time: 12/11/23  8:25 AM  Result Value Ref  Range   Glucose 81 70 - 99 mg/dL   BUN 13 6 - 24 mg/dL   Creatinine, Ser 9.33 0.57 - 1.00 mg/dL   eGFR 892 >40 fO/fpw/8.26   BUN/Creatinine Ratio 20 9 - 23   Sodium 140 134 - 144 mmol/L   Potassium 4.0 3.5 - 5.2 mmol/L   Chloride 100 96 - 106 mmol/L   CO2 24 20 - 29 mmol/L   Calcium 9.8 8.7 - 10.2 mg/dL   Total Protein 6.8 6.0 - 8.5 g/dL   Albumin 4.2 3.9 - 4.9 g/dL   Globulin, Total 2.6 1.5 - 4.5 g/dL   Bilirubin Total 0.3 0.0 - 1.2 mg/dL   Alkaline Phosphatase 63 44 - 121 IU/L   AST 15 0 - 40 IU/L   ALT 10 0 - 32 IU/L  CBC with Differential/Platelet   Collection Time: 12/11/23  8:25 AM  Result Value Ref Range   WBC 10.4 3.4 - 10.8 x10E3/uL   RBC 4.25 3.77 - 5.28 x10E6/uL   Hemoglobin 12.7 11.1 - 15.9 g/dL   Hematocrit 61.4 65.9 - 46.6 %   MCV 91 79 - 97 fL   MCH 29.9 26.6 - 33.0 pg   MCHC 33.0 31.5 - 35.7 g/dL   RDW 85.7 88.2 - 84.5 %   Platelets 493 (H) 150 - 450 x10E3/uL   Neutrophils 59 Not Estab. %   Lymphs 32 Not Estab. %   Monocytes 6 Not Estab. %   Eos 2 Not Estab. %   Basos 0 Not Estab. %   Neutrophils Absolute 6.2 1.4 - 7.0 x10E3/uL   Lymphocytes Absolute 3.3 (H) 0.7 - 3.1 x10E3/uL   Monocytes Absolute 0.6 0.1 - 0.9 x10E3/uL   EOS (ABSOLUTE) 0.2 0.0 - 0.4 x10E3/uL   Basophils Absolute 0.0 0.0 - 0.2 x10E3/uL   Immature Granulocytes 1 Not Estab. %   Immature Grans (Abs) 0.1 0.0 - 0.1 x10E3/uL  Lipid Panel w/o Chol/HDL Ratio   Collection Time: 12/11/23  8:25 AM  Result Value Ref Range   Cholesterol, Total 223 (H) 100 - 199 mg/dL   Triglycerides 748 (H) 0 - 149 mg/dL   HDL 50 >60 mg/dL   VLDL Cholesterol Cal 44 (H) 5 - 40 mg/dL   LDL Chol Calc (NIH) 870 (H) 0 - 99 mg/dL  TSH   Collection Time: 12/11/23  8:25 AM  Result Value Ref Range   TSH 2.190 0.450 - 4.500 uIU/mL  VITAMIN D  25 Hydroxy (Vit-D Deficiency, Fractures)   Collection Time: 12/11/23  8:25 AM  Result Value Ref Range   Vit D, 25-Hydroxy  21.0 (L) 30.0 - 100.0 ng/mL      Assessment & Plan:   Problem List Items Addressed This Visit       Cardiovascular and Mediastinum   Essential hypertension   Chronic, stable.  BP remains at goal in office.  Recommend she monitor BP at least a few mornings a week at home and document.  DASH diet at home.  Continue current medication regimen and adjust as needed. Labs today: CMP and lipid. Return in 6 months.       Relevant Orders   Comprehensive metabolic panel with GFR     Endocrine   Insulin resistance   Took Metformin  in past and tolerated this, obtained via GYN.  Did not tolerate injections and self stopped.  Continue Metformin  and try increasing to 1000 MG BID, if can tolerate.        Other   Obesity  BMI 33.91. She did not tolerate Wegovy  or Mounjaro . Continue Metformin  which she tolerated well in past.  Recommended eating smaller high protein, low fat meals more frequently and exercising 30 mins a day 5 times a week with a goal of 10-15lb weight loss in the next 3 months. Patient voiced their understanding and motivation to adhere to these recommendations.         Relevant Medications   metFORMIN  (GLUCOPHAGE ) 500 MG tablet   Hyperlipidemia   Chronic, ongoing.  Continue current medication regimen and adjust as needed.  Lipid panel today. Return in 6 months.      Relevant Orders   Lipid Panel w/o Chol/HDL Ratio   Comprehensive metabolic panel with GFR   Depression, major, single episode, complete remission - Primary   Remains in remission, no medications.  Denies SI/HI.  Restart Wellbutrin  as needed in future.  PHQ9 - 4 today.  Return in 6 months.        Follow up plan: Return in about 6 months (around 12/13/2024) for Annual Physical -- 12/10/24 after.

## 2024-06-15 NOTE — Assessment & Plan Note (Signed)
 Took Metformin  in past and tolerated this, obtained via GYN.  Did not tolerate injections and self stopped.  Continue Metformin  and try increasing to 1000 MG BID, if can tolerate.

## 2024-06-15 NOTE — Assessment & Plan Note (Signed)
Chronic, ongoing.  Continue current medication regimen and adjust as needed.  Lipid panel today.  Return in 6 months. 

## 2024-06-15 NOTE — Assessment & Plan Note (Signed)
 BMI 33.91. She did not tolerate Wegovy  or Mounjaro . Continue Metformin  which she tolerated well in past.  Recommended eating smaller high protein, low fat meals more frequently and exercising 30 mins a day 5 times a week with a goal of 10-15lb weight loss in the next 3 months. Patient voiced their understanding and motivation to adhere to these recommendations.

## 2024-06-15 NOTE — Assessment & Plan Note (Signed)
 Chronic, stable.  BP remains at goal in office.  Recommend she monitor BP at least a few mornings a week at home and document.  DASH diet at home.  Continue current medication regimen and adjust as needed. Labs today: CMP and lipid. Return in 6 months.

## 2024-06-15 NOTE — Assessment & Plan Note (Signed)
 Remains in remission, no medications.  Denies SI/HI.  Restart Wellbutrin  as needed in future.  PHQ9 - 4 today.  Return in 6 months.

## 2024-06-16 ENCOUNTER — Ambulatory Visit: Payer: Self-pay | Admitting: Nurse Practitioner

## 2024-06-16 LAB — COMPREHENSIVE METABOLIC PANEL WITH GFR
ALT: 13 IU/L (ref 0–32)
AST: 16 IU/L (ref 0–40)
Albumin: 4.3 g/dL (ref 3.9–4.9)
Alkaline Phosphatase: 67 IU/L (ref 41–116)
BUN/Creatinine Ratio: 14 (ref 9–23)
BUN: 10 mg/dL (ref 6–24)
Bilirubin Total: 0.4 mg/dL (ref 0.0–1.2)
CO2: 21 mmol/L (ref 20–29)
Calcium: 9.5 mg/dL (ref 8.7–10.2)
Chloride: 100 mmol/L (ref 96–106)
Creatinine, Ser: 0.69 mg/dL (ref 0.57–1.00)
Globulin, Total: 2.7 g/dL (ref 1.5–4.5)
Glucose: 85 mg/dL (ref 70–99)
Potassium: 3.9 mmol/L (ref 3.5–5.2)
Sodium: 138 mmol/L (ref 134–144)
Total Protein: 7 g/dL (ref 6.0–8.5)
eGFR: 106 mL/min/1.73 (ref >59)

## 2024-06-16 LAB — LIPID PANEL W/O CHOL/HDL RATIO
Cholesterol, Total: 244 mg/dL — ABNORMAL HIGH (ref 100–199)
HDL: 47 mg/dL (ref >39)
LDL Chol Calc (NIH): 158 mg/dL — ABNORMAL HIGH (ref 0–99)
Triglycerides: 213 mg/dL — ABNORMAL HIGH (ref 0–149)
VLDL Cholesterol Cal: 39 mg/dL (ref 5–40)

## 2024-06-16 NOTE — Progress Notes (Signed)
 Contacted via MyChart The 10-year ASCVD risk score (Arnett DK, et al., 2019) is: 2.6%   Values used to calculate the score:     Age: 48 years     Clincally relevant sex: Female     Is Non-Hispanic African American: No     Diabetic: No     Tobacco smoker: No     Systolic Blood Pressure: 126 mmHg     Is BP treated: Yes     HDL Cholesterol: 47 mg/dL     Total Cholesterol: 244 mg/dL  Good afternoon Julia Campbell, your labs have returned. Lipid panel continues to show elevations, continue medication and next visit I will discuss with you an imaging that can be done to further assess. Kidney function, creatinine and eGFR, remains normal, as is liver function, AST and ALT. Any questions? Keep being amazing!!  Thank you for allowing me to participate in your care.  I appreciate you. Kindest regards, Recia Sons

## 2024-07-10 ENCOUNTER — Other Ambulatory Visit: Payer: Self-pay | Admitting: Nurse Practitioner

## 2024-07-13 NOTE — Telephone Encounter (Signed)
 Requested medication (s) are due for refill today: yes  Requested medication (s) are on the active medication list: yes  Last refill:  n/a  Future visit scheduled: no  Notes to clinic:  Medication not assigned to a protocol, review manually.      Requested Prescriptions  Pending Prescriptions Disp Refills   nystatin  ointment (MYCOSTATIN ) [Pharmacy Med Name: NYSTATIN  100,000 UNIT/GM OINT] 30 g 3    Sig: APPLY TO AFFECTED AREA TWICE A DAY     Off-Protocol Failed - 07/13/2024 11:20 AM      Failed - Medication not assigned to a protocol, review manually.      Passed - Valid encounter within last 12 months    Recent Outpatient Visits           4 weeks ago Depression, major, single episode, complete remission   [place] [facility] [place], Boronda T, NP   2 months ago Insulin resistance   [place] [facility] [place], [place] Moon T, NP   7 months ago Depression, major, single episode, complete remission   [place] [facility] [place], Bangor T, NP   8 months ago Acute non-recurrent frontal sinusitis   [place] [facility] [place], Melanie DASEN, NP

## 2024-08-27 NOTE — Progress Notes (Unsigned)
 Gynecology Annual Exam  PCP: Valerio Melanie DASEN, NP  Chief Complaint: No chief complaint on file.   History of Present Illness:Patient is a 48 y.o. Julia Campbell presents for annual exam. The patient has no complaints today.   LMP: No LMP recorded. Menarche:{numbers 0-81:85324} Average Interval: {Desc; regular/irreg:14544}, {numbers 22-35:14824} days Duration of flow: {numbers; 0-10:33138} days Heavy Menses: {yes/no:63} Clots: {yes/no:63} Intermenstrual Bleeding: {yes/no:63} Postcoital Bleeding: {yes/no:63} Dysmenorrhea: {yes/no:63}  The patient {sys sexually active:13135} sexually active. She {has/denies:315300} dyspareunia.  The patient {DOES_DOES WNU:81435} perform self breast exams.  There {is/is no:19420} notable family history of breast or ovarian cancer in her family.  The patient wears seatbelts: {yes/no:63}.   The patient has regular exercise: {yes/no/not asked:9010}.    The patient {Blank single:19197::reports,denies} current symptoms of depression.     Review of Systems: ROS  Past Medical History:  Patient Active Problem List   Diagnosis Date Noted Date Diagnosed   Insulin resistance 04/20/2024    Vitamin D  deficiency 04/29/2020    Depression, major, single episode, complete remission 04/09/2017    Low grade squamous intraepith lesion on cytologic smear cervix (lgsil) 04/08/2017     On 04/03/17 = With positive HRHPV.-colposcopy done    Obesity 06/05/2016    Essential hypertension 04/04/2016    Hyperlipidemia      Past Surgical History:  Past Surgical History:  Procedure Laterality Date   COLPOSCOPY     TUBAL LIGATION      Gynecologic History:  No LMP recorded. Last Pap: Results were: *** {Findings; lab pap smear results:16707::NIL and HR HPV+,NIL and HR HPV negative}  Last mammogram: *** Results were: {Blank single:19197::***,BI-RADS IV,BI-RAD III,BI-RAD II,BI-RAD I}  Obstetric History: Julia Campbell  Family History:  Family History   Problem Relation Age of Onset   Kidney disease Mother        Stage 5   Hypertension Mother    Obesity Mother    Hypertension Father    Throat cancer Father        late 56s   Stroke Maternal Grandmother    Heart disease Maternal Grandmother    Heart attack Maternal Grandfather    Cancer Neg Hx    Diabetes Neg Hx    Breast cancer Neg Hx     Social History:  Social History   Socioeconomic History   Marital status: Married    Spouse name: Not on file   Number of children: 2   Years of education: Not on file   Highest education level: Not on file  Occupational History   Occupation: [employer] Manager  Tobacco Use   Smoking status: Never   Smokeless tobacco: Never  Vaping Use   Vaping status: Never Used  Substance and Sexual Activity   Alcohol use: No    Alcohol/week: 0.0 standard drinks of alcohol   Drug use: No   Sexual activity: Yes    Birth control/protection: Surgical    Comment: tubal ligation  Other Topics Concern   Not on file  Social History Narrative   Not on file   Social Drivers of Health   Tobacco Use: Low Risk (06/15/2024)   Patient History    Smoking Tobacco Use: Never    Smokeless Tobacco Use: Never    Passive Exposure: Not on file  Financial Resource Strain: Not on file  Food Insecurity: Not on file  Transportation Needs: Not on file  Physical Activity: Not on file  Stress: Not on file  Social Connections: Not on file  Intimate Partner  Violence: Not on file  Depression (PHQ2-9): Low Risk (06/15/2024)   Depression (PHQ2-9)    PHQ-2 Score: 4  Alcohol Screen: Not on file  Housing: Not on file  Utilities: Not on file  Health Literacy: Not on file    Allergies:  Allergies[1]  Medications: Prior to Admission medications  Medication Sig Start Date End Date Taking? Authorizing Provider  gemfibrozil  (LOPID ) 600 MG tablet TAKE 1 TABLET BY MOUTH TWICE A DAY BEFORE MEALS 12/11/23   Cannady, Jolene T, NP  hydrochlorothiazide  (HYDRODIURIL ) 25 MG  tablet Take 1 tablet (25 mg total) by mouth daily. 12/11/23   Cannady, Jolene T, NP  losartan  (COZAAR ) 25 MG tablet Take 1 tablet (25 mg total) by mouth daily. 12/11/23   Cannady, Jolene T, NP  metFORMIN  (GLUCOPHAGE ) 500 MG tablet Take 2 tablets (1,000 mg total) by mouth 2 (two) times daily with a meal. 06/15/24   Cannady, Jolene T, NP  nystatin  ointment (MYCOSTATIN ) APPLY TO AFFECTED AREA TWICE A DAY 07/13/24   Valerio Melanie DASEN, NP    Physical Exam Vitals: There were no vitals taken for this visit.  General: NAD HEENT: normocephalic, anicteric Thyroid : no enlargement, no palpable nodules Pulmonary: No increased work of breathing, CTAB Cardiovascular: RRR, distal pulses 2+ Breast: Breast symmetrical, no tenderness, no palpable nodules or masses, no skin or nipple retraction present, no nipple discharge.  No axillary or supraclavicular lymphadenopathy. Abdomen: NABS, soft, non-tender, non-distended.  Umbilicus without lesions.  No hepatomegaly, splenomegaly or masses palpable. No evidence of hernia  Genitourinary:  External: Normal external female genitalia.  Normal urethral meatus, normal Bartholin's and Skene's glands.    Vagina: Normal vaginal mucosa, no evidence of prolapse.    Cervix: Grossly normal in appearance, no bleeding  Uterus: Non-enlarged, mobile, normal contour.  No CMT  Adnexa: ovaries non-enlarged, no adnexal masses  Rectal: deferred  Lymphatic: no evidence of inguinal lymphadenopathy Extremities: no edema, erythema, or tenderness Neurologic: Grossly intact Psychiatric: mood appropriate, affect full  Female chaperone present for pelvic and breast  portions of the physical exam     Assessment: 48 y.o. Julia Campbell routine annual exam  Plan: Problem List Items Addressed This Visit   None Visit Diagnoses       Encounter for annual routine gynecological examination    -  Primary       1) Mammogram - recommend yearly screening mammogram.  Mammogram {Blank  single:19197::Is up to date,Was ordered today}  2) STI screening  {Blank single:19197::was,was not}offered and {Blank single:19197::accepted,declined,therefore not obtained}  3) ASCCP guidelines and rational discussed.  Patient opts for {Blank single:19197::***,every 5 years,every 3 years,yearly,discontinue age >65,discontinue secondary to prior hysterectomy} screening interval  4) Osteoporosis  - per USPTF routine screening DEXA at age 61 - FRAX 10 year major fracture risk ***,  10 year hip fracture risk ***  Consider FDA-approved medical therapies in postmenopausal women and men aged 44 years and older, based on the following: a) A hip or vertebral (clinical or morphometric) fracture b) T-score <= -2.5 at the femoral neck or spine after appropriate evaluation to exclude secondary causes C) Low bone mass (T-score between -1.0 and -2.5 at the femoral neck or spine) and a 10-year probability of a hip fracture >= 3% or a 10-year probability of a major osteoporosis-related fracture >= 20% based on the [place] -adapted WHO algorithm   5) Routine healthcare maintenance including cholesterol, diabetes screening discussed {Blank single:19197::managed by PCP,Ordered today,To return fasting at a later date,Declines}  6) Colonoscopy ***.  Screening recommended  starting at age 73 for average risk individuals, age 30 for individuals deemed at increased risk (including African Americans) and recommended to continue until age 59.  For patient age 59-85 individualized approach is recommended.  Gold standard screening is via colonoscopy, Cologuard screening is an acceptable alternative for patient unwilling or unable to undergo colonoscopy.  Colorectal cancer screening for average?risk adults: 2018 guideline update from the American Cancer SocietyCA: A Cancer Journal for Clinicians: Feb 13, 2017   7) No follow-ups on file.   Jinnie Cookey, CNM  [facility] 08/27/2024, 9:57  AM               [1] No Known Allergies

## 2024-08-28 ENCOUNTER — Encounter: Payer: Self-pay | Admitting: Licensed Practical Nurse

## 2024-08-28 ENCOUNTER — Ambulatory Visit: Admitting: Licensed Practical Nurse

## 2024-08-28 VITALS — BP 132/79 | HR 96 | Ht 63.0 in | Wt 189.9 lb

## 2024-08-28 DIAGNOSIS — Z01419 Encounter for gynecological examination (general) (routine) without abnormal findings: Secondary | ICD-10-CM

## 2024-09-22 ENCOUNTER — Encounter: Payer: Self-pay | Admitting: Nurse Practitioner

## 2024-09-25 ENCOUNTER — Telehealth: Admitting: Nurse Practitioner

## 2024-09-25 ENCOUNTER — Encounter: Payer: Self-pay | Admitting: Nurse Practitioner

## 2024-09-25 VITALS — Ht 62.99 in | Wt 189.0 lb

## 2024-09-25 DIAGNOSIS — Z6831 Body mass index (BMI) 31.0-31.9, adult: Secondary | ICD-10-CM | POA: Diagnosis not present

## 2024-09-25 DIAGNOSIS — E88819 Insulin resistance, unspecified: Secondary | ICD-10-CM

## 2024-09-25 DIAGNOSIS — E66811 Obesity, class 1: Secondary | ICD-10-CM | POA: Diagnosis not present

## 2024-09-25 DIAGNOSIS — E6609 Other obesity due to excess calories: Secondary | ICD-10-CM

## 2024-09-25 NOTE — Patient Instructions (Signed)

## 2024-09-25 NOTE — Progress Notes (Signed)
 "  Ht 5' 2.99 (1.6 m)   Wt 189 lb (85.7 kg)   LMP 08/21/2024 (Approximate)   BMI 33.49 kg/m    Subjective:    Patient ID: Julia Campbell, female    DOB: 06/02/1976, 49 y.o.   MRN: [id number]  HPI: Julia Campbell is a 49 y.o. female  Chief Complaint  Patient presents with   Weight Loss    Wants to talk about weight loss medications.    Virtual Visit via Video Note  I connected with Julia Campbell on 09/25/2024 at  9:00 AM EST by a video enabled telemedicine application and verified that I am speaking with the correct person using two identifiers.  Location: Patient: work [employer]: work   I discussed the limitations of evaluation and management by telemedicine and the availability of in person appointments. The patient expressed understanding and agreed to proceed.  I provided 25 minutes of non-face-to-face time during this encounter.   Fanta Wimberley T Sabella Traore, NP   WEIGHT GAIN Currently taking Metformin  for loss.  Has taken GLP1 injections, Zepbound  and Wegovy  in past, overall tolerated them but they became costly. Would like to get Wegovy  pill prescribed. No family history of thyroid  cancer (MTC, MEN 2, thyroid  cell tumors) or pancreatitis.   When available she would like sent to [facility] in [facility] [place]. Duration: chronic Previous attempts at weight loss: yes Complications of obesity: HTN and HLD Peak weight: 197 lbs Weight loss goal: 140 lbs Weight loss to date: none Requesting obesity pharmacotherapy: yes Current weight loss supplements/medications: yes Previous weight loss supplements/meds: yes Calories:  2000  Relevant past medical, surgical, family and social history reviewed and updated as indicated. Interim medical history since our last visit reviewed. Allergies and medications reviewed and updated.  Review of Systems  Constitutional:  Negative for activity change, appetite change, diaphoresis, fatigue and fever.  Respiratory:  Negative for cough, chest  tightness and shortness of breath.   Cardiovascular:  Negative for chest pain, palpitations and leg swelling.  Gastrointestinal: Negative.   Neurological: Negative.   Psychiatric/Behavioral: Negative.      Per HPI unless specifically indicated above     Objective:    Ht 5' 2.99 (1.6 m)   Wt 189 lb (85.7 kg)   LMP 08/21/2024 (Approximate)   BMI 33.49 kg/m   Wt Readings from Last 3 Encounters:  09/25/24 189 lb (85.7 kg)  08/28/24 189 lb 14.4 oz (86.1 kg)  06/15/24 188 lb 6.4 oz (85.5 kg)    Physical Exam Vitals and nursing note reviewed.  Constitutional:      General: She is awake. She is not in acute distress.    Appearance: She is well-developed and well-groomed. She is obese. She is not ill-appearing.  HENT:     Head: Normocephalic.     Right Ear: Hearing normal.     Left Ear: Hearing normal.  Eyes:     General: Lids are normal.        Right eye: No discharge.        Left eye: No discharge.     Conjunctiva/sclera: Conjunctivae normal.  Pulmonary:     Effort: Pulmonary effort is normal. No accessory muscle usage or respiratory distress.  Musculoskeletal:     Cervical back: Normal range of motion.  Neurological:     Mental Status: She is alert and oriented to person, place, and time.  Psychiatric:        Attention and Perception: Attention normal.  Mood and Affect: Mood normal.        Behavior: Behavior normal. Behavior is cooperative.        Thought Content: Thought content normal.        Judgment: Judgment normal.     Results for orders placed or performed in visit on 06/15/24  Lipid Panel w/o Chol/HDL Ratio   Collection Time: 06/15/24  8:38 AM  Result Value Ref Range   Cholesterol, Total 244 (H) 100 - 199 mg/dL   Triglycerides 786 (H) 0 - 149 mg/dL   HDL 47 >60 mg/dL   VLDL Cholesterol Cal 39 5 - 40 mg/dL   LDL Chol Calc (NIH) 841 (H) 0 - 99 mg/dL  Comprehensive metabolic panel with GFR   Collection Time: 06/15/24  8:38 AM  Result Value Ref Range    Glucose 85 70 - 99 mg/dL   BUN 10 6 - 24 mg/dL   Creatinine, Ser 9.30 0.57 - 1.00 mg/dL   eGFR 893 >40 fO/fpw/8.26   BUN/Creatinine Ratio 14 9 - 23   Sodium 138 134 - 144 mmol/L   Potassium 3.9 3.5 - 5.2 mmol/L   Chloride 100 96 - 106 mmol/L   CO2 21 20 - 29 mmol/L   Calcium 9.5 8.7 - 10.2 mg/dL   Total Protein 7.0 6.0 - 8.5 g/dL   Albumin 4.3 3.9 - 4.9 g/dL   Globulin, Total 2.7 1.5 - 4.5 g/dL   Bilirubin Total 0.4 0.0 - 1.2 mg/dL   Alkaline Phosphatase 67 41 - 116 IU/L   AST 16 0 - 40 IU/L   ALT 13 0 - 32 IU/L      Assessment & Plan:   Problem List Items Addressed This Visit       Endocrine   Insulin resistance   Taking Metformin .  Continue Metformin  and try increasing to 1000 MG BID, but transition to Wegovy  pill when available to order. She tolerated injections in past, but they became costly.        Other   Obesity - Primary   BMI 33.49. She took Wegovy  and Zepbound  in past but they became too costly. Continue Metformin  which she is tolerating, but as soon as Wegovy  pill built into system to order will do so.  Discussed with her PCP will check in one week and send in if available. Then she would stop Metformin .  Recommended eating smaller high protein, low fat meals more frequently and exercising 30 mins a day 5 times a week with a goal of 10-15lb weight loss in the next 3 months. Patient voiced their understanding and motivation to adhere to these recommendations.            Follow up plan: Return in about 7 weeks (around 11/13/2024) for WEIGHT CHECK -- started Wegovy  pill.      "

## 2024-09-25 NOTE — Assessment & Plan Note (Signed)
 BMI 33.49. She took Wegovy  and Zepbound  in past but they became too costly. Continue Metformin  which she is tolerating, but as soon as Wegovy  pill built into system to order will do so.  Discussed with her PCP will check in one week and send in if available. Then she would stop Metformin .  Recommended eating smaller high protein, low fat meals more frequently and exercising 30 mins a day 5 times a week with a goal of 10-15lb weight loss in the next 3 months. Patient voiced their understanding and motivation to adhere to these recommendations.

## 2024-09-25 NOTE — Assessment & Plan Note (Signed)
 Taking Metformin .  Continue Metformin  and try increasing to 1000 MG BID, but transition to Wegovy  pill when available to order. She tolerated injections in past, but they became costly.

## 2024-10-01 MED ORDER — WEGOVY 1.5 MG PO TABS: 1.5000 mg | ORAL_TABLET | Freq: Every day | ORAL | 1 refills | Status: AC

## 2024-10-01 NOTE — Addendum Note (Signed)
 Addended by: Cecylia Brazill T on: 10/01/2024 04:30 PM   Modules accepted: Orders

## 2024-11-11 ENCOUNTER — Ambulatory Visit: Admitting: Nurse Practitioner

## 2024-12-15 ENCOUNTER — Ambulatory Visit: Admitting: Nurse Practitioner
